# Patient Record
Sex: Female | Born: 1975
Health system: Southern US, Community
[De-identification: ages and names within clinical notes are randomized; demographics above are authoritative.]

## PROBLEM LIST (undated history)

## (undated) DIAGNOSIS — D649 Anemia, unspecified: Secondary | ICD-10-CM

## (undated) DIAGNOSIS — K5900 Constipation, unspecified: Secondary | ICD-10-CM

## (undated) DIAGNOSIS — R450 Nervousness: Secondary | ICD-10-CM

## (undated) DIAGNOSIS — B019 Varicella without complication: Secondary | ICD-10-CM

## (undated) DIAGNOSIS — R5383 Other fatigue: Secondary | ICD-10-CM

## (undated) DIAGNOSIS — F419 Anxiety disorder, unspecified: Secondary | ICD-10-CM

## (undated) DIAGNOSIS — F439 Reaction to severe stress, unspecified: Secondary | ICD-10-CM

## (undated) DIAGNOSIS — Z9289 Personal history of other medical treatment: Secondary | ICD-10-CM

## (undated) DIAGNOSIS — E559 Vitamin D deficiency, unspecified: Secondary | ICD-10-CM

## (undated) HISTORY — DX: Varicella without complication: B01.9

## (undated) HISTORY — DX: Constipation, unspecified: K59.00

## (undated) HISTORY — DX: Other fatigue: R53.83

## (undated) HISTORY — DX: Nervousness: R45.0

## (undated) HISTORY — DX: Personal history of other medical treatment: Z92.89

## (undated) HISTORY — DX: Anxiety disorder, unspecified: F41.9

## (undated) HISTORY — DX: Vitamin D deficiency, unspecified: E55.9

## (undated) HISTORY — DX: Reaction to severe stress, unspecified: F43.9

---

## 2013-01-29 ENCOUNTER — Encounter (HOSPITAL_COMMUNITY): Payer: Self-pay | Admitting: *Deleted

## 2013-01-29 ENCOUNTER — Observation Stay (HOSPITAL_COMMUNITY)
Admission: AD | Admit: 2013-01-29 | Discharge: 2013-01-29 | Disposition: A | Discharge status: Home / Self Care | Payer: BC Managed Care – PPO | Source: Ambulatory Visit | Attending: Obstetrics and Gynecology | Admitting: Obstetrics and Gynecology

## 2013-01-29 DIAGNOSIS — N920 Excessive and frequent menstruation with regular cycle: Principal | ICD-10-CM | POA: Insufficient documentation

## 2013-01-29 DIAGNOSIS — D649 Anemia, unspecified: Secondary | ICD-10-CM | POA: Diagnosis present

## 2013-01-29 DIAGNOSIS — D500 Iron deficiency anemia secondary to blood loss (chronic): Secondary | ICD-10-CM | POA: Insufficient documentation

## 2013-01-29 LAB — CBC WITH DIFFERENTIAL/PLATELET
Basophils Absolute: 0 10*3/uL (ref 0.0–0.1)
Basophils Relative: 0 % (ref 0–1)
Eosinophils Absolute: 0.4 10*3/uL (ref 0.0–0.7)
Eosinophils Relative: 5 % (ref 0–5)
HCT: 26.5 % — ABNORMAL LOW (ref 36.0–46.0)
MCHC: 33.2 g/dL (ref 30.0–36.0)
MCV: 79.1 fL (ref 78.0–100.0)
Monocytes Absolute: 0.8 10*3/uL (ref 0.1–1.0)
Platelets: 273 10*3/uL (ref 150–400)
RDW: 18.3 % — ABNORMAL HIGH (ref 11.5–15.5)

## 2013-01-29 LAB — PREPARE RBC (CROSSMATCH)

## 2013-01-29 LAB — ABO/RH: ABO/RH(D): A POS

## 2013-01-29 MED ORDER — DIPHENHYDRAMINE HCL 25 MG PO CAPS
25.0000 mg | ORAL_CAPSULE | Freq: Once | ORAL | Status: AC
  Administered 2013-01-29: 25 mg via ORAL
  Filled 2013-01-29: qty 1

## 2013-01-29 MED ORDER — SODIUM CHLORIDE 0.9 % IV SOLN
INTRAVENOUS | Status: DC
  Administered 2013-01-29: 20 mL/h via INTRAVENOUS

## 2013-01-29 MED ORDER — ACETAMINOPHEN 325 MG PO TABS
650.0000 mg | ORAL_TABLET | Freq: Once | ORAL | Status: AC
  Administered 2013-01-29: 650 mg via ORAL
  Filled 2013-01-29: qty 2

## 2013-01-29 NOTE — Discharge Summary (Signed)
  Physician Discharge Summary  Patient ID: Madison Robinson MRN: [id number] DOB/AGE: 38-Dec-1977 28 y.o.  Admit date: 01/29/2013 Discharge date: 01/29/2013  Admission Diagnoses: BLOOD TRANSFUSION for severe anemia  Discharge Diagnoses:  same  Discharged Condition: good  Hospital Course:   Consults: None  Treatments: transfusion of 3 units [facility]  Discharge Hgb/Hct:8.8 / 26.5  Disposition: home  Follow-up: Dr Su Hilt as scheduled      Signed: Esmeralda Arthur, MD 01/29/2013, 1:44 PM

## 2013-01-29 NOTE — H&P (Signed)
Madison Robinson is an 37 y.o. female. Pt seen today in office by Dr Madison Robinson for heavy bleeding. LMP 6-17. Then onset bleeding on 7-10 that was heavy (changing pad and tampon every ), bleeding slowed today. Per note from Dr Madison Robinson   Pertinent Gynecological History: Menses: flow is excessive with use of hourly pads or tampons on heaviest days Bleeding: intermenstrual bleeding Contraception: OCP (estrogen/progesterone) Madison Robinson  DES exposure: denies Blood transfusions: none Sexually transmitted diseases: no past history Previous GYN Procedures: none  Last mammogram: n/a Date:  Last pap: normal Date: 11/2010 OB History: G1, P1   Menstrual History: Menarche age: 52  Patient's last menstrual period was 01/05/2013.    History reviewed. No pertinent past medical history.  History reviewed. No pertinent past surgical history.  Family History  Problem Relation Age of Onset  . Hypertension Mother   . Diabetes Father   . Hypertension Father   . Cancer Paternal Grandmother     Social History:  reports that  drinks alcohol. She reports that she does not use illicit drugs. Her tobacco history is not on file. 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  Respiratory: Positive for shortness of breath.        W exertion   Cardiovascular: Positive for palpitations.  Gastrointestinal: Negative for heartburn, nausea, vomiting and abdominal pain.  Genitourinary: Negative for dysuria.  Neurological: Positive for weakness and headaches. Negative for dizziness and loss of consciousness.  All other systems reviewed and are negative.    Blood pressure 113/74, pulse 108, temperature 98.8 F (37.1 C), temperature source Oral, resp. rate 22, height 4' 11.5" (1.511 m), weight 150 lb 0.1 oz  (68.042 kg), last menstrual period 01/05/2013, SpO2 100.00%.  Filed Vitals:   01/29/13 0130 01/29/13 0131 01/29/13 0132 01/29/13 0135  BP: 115/74 109/71 113/74 113/74  Pulse: 100 96 96 108  Temp:      TempSrc:      Resp:      Height:      Weight:      SpO2:         Physical Exam  Nursing note and vitals reviewed. Constitutional: She is oriented to person, place, and time. She appears well-developed and well-nourished. No distress.  HENT:  Head: Normocephalic.  Eyes: Pupils are equal, round, and reactive to light.  Neck: Normal range of motion.  Cardiovascular: Regular rhythm and normal heart sounds.   Tachycardic   Respiratory: Effort normal and breath sounds normal.  GI: Soft. Bowel sounds are normal. She exhibits no distension. There is no tenderness.  Genitourinary:  Deferred   Musculoskeletal: Normal range of motion.  Neurological: She is alert and oriented to person, place, and time. She has normal reflexes.  Skin: Skin is warm and dry.  Psychiatric: She has a normal mood and affect. Her behavior is normal.    No results found for this or any previous visit (from the past 24 hour(s)).  No results found.  Assessment/Plan: 37yo AAF  Menorrhagia symptomatic anemia   hgb 5.7 per report from [facility] (full report not available at the time of this note)  Admit to observation to 3rd floor per c/w Dr Pennie Rushing  Transfuse 3 units PRBC Tylenol and benadryl PO prior to tx Will check CBC after 3rd  unit tx'd rv'd R/B blood tx, pt accepts risks and wishes to proceed with transfusion    Madison Robinson M 01/29/2013, 1:54 AM

## 2013-01-29 NOTE — Progress Notes (Signed)
HD# 1  Subjective: Patient reports feeling much better with resolved SOB and palpitations, denies dizziness and other anemia symptoms Tolerating normal  diet without difficulty. No nausea / vomiting.  Ambulating and voiding.  Objective: BP 120/66  Pulse 100  Temp(Src) 98.2 F (36.8 C) (Oral)  Resp 18  Ht 4' 11.5" (1.511 m)  Wt 150 lb 0.1 oz (68.042 kg)  BMI 29.8 kg/m2  SpO2 100%  LMP 01/05/2013 Lungs: clear Heart: normal rate and rhythm Abdomen:soft and appropriately tender Extremities: Homans sign is negative, no sign of DVT  Assessment: s/p : anemia and transfusion of 3 units PRC Doing well  Plan: Discharge home Follow-up with Dr Su Hilt as scheduled to pursus menorrhagia work-up and plan of care Patient encouraged to call with heavy bleeding  LOS: 0 days    Ainhoa Rallo A 01/29/2013, 1:21 PM

## 2013-01-30 LAB — TYPE AND SCREEN
ABO/RH(D): A POS
Unit division: 0

## 2013-02-22 ENCOUNTER — Other Ambulatory Visit: Payer: Self-pay | Admitting: Obstetrics and Gynecology

## 2013-03-02 ENCOUNTER — Other Ambulatory Visit: Payer: Self-pay | Admitting: Obstetrics and Gynecology

## 2013-03-03 ENCOUNTER — Other Ambulatory Visit: Payer: Self-pay | Admitting: Obstetrics and Gynecology

## 2013-03-03 NOTE — H&P (Signed)
Madison Robinson is a 37 YO female,  P 1-0-0-1, who   presents for  hysteroscopy, dilatation and curettage because of menorrhagia, severe anemia and endometrial masses.  Patient has a history of regular menses until January 05, 2013,  when she bled so much that her hemoglobin decreased to 5.7.  She had to change her pad every 30 minutes but experienced minimal cramps.  Consequently she was transfused 3 units of packed red blood cells raising her hemoglobin level to 8.8.  Patient has continued to take birth control pills and denies, urinary tract symptoms, changes in bowel function, vaginitis symptoms or dyspareunia. A pelvic ultrasound, February 19, 2013 showed uterus: 5.70 x 5.74 x 4.71 cm, endometrium: 1.80 mm, a posterior right intramural fibroid: 3.19 x 2.49 x 2.86 cm and #2 separate masses within endometrial cavity, a color stalk on color Doppler 1.4 x 1.4 x 1.3 cm and 1.7 x 1.2 x 1.2 cm-sub-mucosal fibroid vs polyps cannot be ruled out.  Both ovaries appeared normal on this study.  On February 19, 2013 her hemoglobin was 9.8.  Given the severity of her vaginal bleeding and the findings on ultrasound, the patient has decided to proceed with hysteroscopic resection of endometrial mass and dilatation with curettage  . Past Medical History  OB History: G1;  P 1-0-0-1. SVD, 1998  GYN History: menarche: 37 YO;  LMP: 01/05/2013; Contracepton oral contraceptives (estrogen/progesterone)  The patient denies history of sexually transmitted disease.  Denies history of abnormal PAP smear;  Last PAP smear: 2013  Medical History:  Vitamin D Deficency and Severe Anemia (HGB as low as 5.7)  Surgical History: Negative  Family History: Hypertension, Diabetes and Cancer  Social History: Single and employed as an RN with [employer]: Denies alcohol, tobacco or illicit drug use  Medications:  Jolessa 0.15/30 daily Vitamin D 50,000 units twice a week  Allergic to Shellfish  Denies sensitivity to peanuts, soy,  latex or adhesives.   ROS: Admits to occasional headache but denies vision changes, nasal congestion, dysphagia, tinnitus, dizziness, hoarseness, cough,  chest pain, shortness of breath, nausea, vomiting, diarrhea,constipation,  urinary frequency, urgency  dysuria, hematuria, vaginitis symptoms, pelvic pain, swelling of joints,easy bruising,  myalgias, arthralgias, skin rashes, unexplained weight loss and except as is mentioned in the history of present illness, patient's review of systems is otherwise negative.  Physical Exam  Bp: 110/58;  P: 90;  R: 16;  Temp.: 98.9 degrees F; Weight: 155 lbs.  Height: 4' 11"    BMI: 31.3  Neck: supple without masses or thyromegaly Lungs: clear to auscultation Heart: regular rate and rhythm Abdomen: soft, non-tender and no organomegaly Pelvic:EGBUS- wnl; vagina-normal rugae; uterus-normal size, cervix without lesions or motion tenderness; adnexae-no tenderness or masses Extremities:  no clubbing, cyanosis or edema   Assesment:  Menorrhagia                       Endometrial Masses                       H/O Severe Anemia    Disposition:  A discussion was held with patient regarding the indication for her procedure(s) along with the risks, which include but are not limited to: reaction to anesthesia, damage to adjacent organs, infection, endometrial scarring  and excessive bleeding. The patient verbalized understanding of these risks and has consented to proceed with Hysteroscopy, Dilatation, Curettage and Possible Resection of Endometrial Mass at [facility],    March 17, 2013 at 12 noon.   CSN# [id number]   Jun Rightmyer J. Quaneshia Wareing, PA-C  for Dr. Angela Y.Roberts   

## 2013-03-04 ENCOUNTER — Encounter (HOSPITAL_COMMUNITY): Payer: Self-pay | Admitting: Pharmacy Technician

## 2013-03-05 ENCOUNTER — Encounter (HOSPITAL_COMMUNITY)
Admission: RE | Admit: 2013-03-05 | Discharge: 2013-03-05 | Disposition: A | Discharge status: Home / Self Care | Payer: BC Managed Care – PPO | Source: Ambulatory Visit | Attending: Obstetrics and Gynecology | Admitting: Obstetrics and Gynecology

## 2013-03-05 ENCOUNTER — Encounter (HOSPITAL_COMMUNITY): Payer: Self-pay

## 2013-03-05 DIAGNOSIS — Z01818 Encounter for other preprocedural examination: Secondary | ICD-10-CM | POA: Insufficient documentation

## 2013-03-05 DIAGNOSIS — Z01812 Encounter for preprocedural laboratory examination: Secondary | ICD-10-CM | POA: Insufficient documentation

## 2013-03-05 HISTORY — DX: Anemia, unspecified: D64.9

## 2013-03-05 LAB — CBC
MCHC: 31.9 g/dL (ref 30.0–36.0)
Platelets: 307 10*3/uL (ref 150–400)
RDW: 18.7 % — ABNORMAL HIGH (ref 11.5–15.5)

## 2013-03-05 NOTE — Pre-Procedure Instructions (Signed)
SDS BB history log given to lab at 0932am

## 2013-03-05 NOTE — Patient Instructions (Addendum)
   Your procedure is scheduled on: 03/17/2013  Enter through the Main Entrance of [facility] at: 1030am Pick up the phone at the desk and dial [phone] and inform us of your arrival.  Please call this number if you have any problems the morning of surgery: [phone]  Remember: Do not eat food after midnight: Tuesday 03/16/2013 Do not drink clear liquids after:0800AM 03/17/2013 Take these medicines the morning of surgery with a SIP OF WATER: N/A  Do not wear jewelry, make-up, or FINGER nail polish No metal in your hair or on your body. Do not wear lotions, powders, perfumes. You may wear deodorant.  Do not bring valuables to the hospital. Contacts, dentures or bridgework may not be worn into surgery.  Leave suitcase in the car. After Surgery it may be brought to your room. For patients being admitted to the hospital, checkout time is 11:00am the day of discharge.  Patients discharged on the day of surgery will not be allowed to drive home.

## 2013-03-05 NOTE — Pre-Procedure Instructions (Signed)
Pt takes Seasonale and reports having menstrual cycles quarterly LMP 01/2013

## 2013-03-05 NOTE — Pre-Procedure Instructions (Deleted)
SDS BB history log given to lab

## 2013-03-17 ENCOUNTER — Encounter (HOSPITAL_COMMUNITY): Payer: Self-pay | Admitting: Anesthesiology

## 2013-03-17 ENCOUNTER — Ambulatory Visit (HOSPITAL_COMMUNITY)
Admission: RE | Admit: 2013-03-17 | Discharge: 2013-03-17 | Disposition: A | Discharge status: Home / Self Care | Payer: BC Managed Care – PPO | Source: Ambulatory Visit | Attending: Obstetrics and Gynecology | Admitting: Obstetrics and Gynecology

## 2013-03-17 ENCOUNTER — Encounter (HOSPITAL_COMMUNITY)
Admission: RE | Disposition: A | Discharge status: Home / Self Care | Payer: Self-pay | Source: Ambulatory Visit | Attending: Obstetrics and Gynecology

## 2013-03-17 ENCOUNTER — Ambulatory Visit (HOSPITAL_COMMUNITY): Payer: BC Managed Care – PPO | Admitting: Anesthesiology

## 2013-03-17 DIAGNOSIS — N920 Excessive and frequent menstruation with regular cycle: Secondary | ICD-10-CM | POA: Insufficient documentation

## 2013-03-17 DIAGNOSIS — D500 Iron deficiency anemia secondary to blood loss (chronic): Secondary | ICD-10-CM | POA: Insufficient documentation

## 2013-03-17 DIAGNOSIS — D251 Intramural leiomyoma of uterus: Secondary | ICD-10-CM | POA: Insufficient documentation

## 2013-03-17 DIAGNOSIS — D250 Submucous leiomyoma of uterus: Secondary | ICD-10-CM | POA: Insufficient documentation

## 2013-03-17 HISTORY — PX: DILATATION & CURRETTAGE/HYSTEROSCOPY WITH RESECTOCOPE: SHX5572

## 2013-03-17 LAB — CBC
HCT: 30.5 % — ABNORMAL LOW (ref 36.0–46.0)
Hemoglobin: 9.7 g/dL — ABNORMAL LOW (ref 12.0–15.0)
MCH: 25.7 pg — ABNORMAL LOW (ref 26.0–34.0)
MCHC: 31.8 g/dL (ref 30.0–36.0)
MCV: 80.9 fL (ref 78.0–100.0)

## 2013-03-17 SURGERY — DILATATION & CURETTAGE/HYSTEROSCOPY WITH RESECTOCOPE
Anesthesia: General | Site: Cervix | Wound class: Contaminated

## 2013-03-17 MED ORDER — GLYCINE 1.5 % IR SOLN
Status: DC | PRN
  Administered 2013-03-17: 3000 mL

## 2013-03-17 MED ORDER — FENTANYL CITRATE 0.05 MG/ML IJ SOLN: 25.0000 ug | INTRAMUSCULAR | Status: DC | PRN

## 2013-03-17 MED ORDER — MIDAZOLAM HCL 5 MG/5ML IJ SOLN
INTRAMUSCULAR | Status: DC | PRN
  Administered 2013-03-17: 2 mg via INTRAVENOUS

## 2013-03-17 MED ORDER — ONDANSETRON HCL 4 MG/2ML IJ SOLN
INTRAMUSCULAR | Status: DC | PRN
  Administered 2013-03-17: 4 mg via INTRAVENOUS

## 2013-03-17 MED ORDER — PROPOFOL 10 MG/ML IV BOLUS
INTRAVENOUS | Status: DC | PRN
  Administered 2013-03-17: 120 mg via INTRAVENOUS

## 2013-03-17 MED ORDER — LACTATED RINGERS IV SOLN
INTRAVENOUS | Status: DC
  Administered 2013-03-17 (×2): via INTRAVENOUS

## 2013-03-17 MED ORDER — METOCLOPRAMIDE HCL 5 MG/ML IJ SOLN: 10.0000 mg | Freq: Once | INTRAMUSCULAR | Status: DC | PRN

## 2013-03-17 MED ORDER — KETOROLAC TROMETHAMINE 30 MG/ML IJ SOLN
INTRAMUSCULAR | Status: AC
  Filled 2013-03-17: qty 1

## 2013-03-17 MED ORDER — LIDOCAINE HCL (CARDIAC) 20 MG/ML IV SOLN
INTRAVENOUS | Status: DC | PRN
  Administered 2013-03-17: 50 mg via INTRAVENOUS

## 2013-03-17 MED ORDER — KETOROLAC TROMETHAMINE 30 MG/ML IJ SOLN
INTRAMUSCULAR | Status: DC | PRN
  Administered 2013-03-17: 30 mg via INTRAVENOUS

## 2013-03-17 MED ORDER — MEPERIDINE HCL 25 MG/ML IJ SOLN: 6.2500 mg | INTRAMUSCULAR | Status: DC | PRN

## 2013-03-17 MED ORDER — LIDOCAINE HCL 1 % IJ SOLN
INTRAMUSCULAR | Status: DC | PRN
  Administered 2013-03-17: 10 mL

## 2013-03-17 MED ORDER — FENTANYL CITRATE 0.05 MG/ML IJ SOLN
INTRAMUSCULAR | Status: AC
  Filled 2013-03-17: qty 2

## 2013-03-17 MED ORDER — ONDANSETRON HCL 4 MG/2ML IJ SOLN
INTRAMUSCULAR | Status: AC
  Filled 2013-03-17: qty 2

## 2013-03-17 MED ORDER — FENTANYL CITRATE 0.05 MG/ML IJ SOLN
INTRAMUSCULAR | Status: DC | PRN
  Administered 2013-03-17: 100 ug via INTRAVENOUS

## 2013-03-17 MED ORDER — DEXAMETHASONE SODIUM PHOSPHATE 10 MG/ML IJ SOLN
INTRAMUSCULAR | Status: AC
  Filled 2013-03-17: qty 1

## 2013-03-17 MED ORDER — MIDAZOLAM HCL 2 MG/2ML IJ SOLN
INTRAMUSCULAR | Status: AC
  Filled 2013-03-17: qty 2

## 2013-03-17 MED ORDER — OXYCODONE-ACETAMINOPHEN 5-325 MG PO TABS: 1.0000 | ORAL_TABLET | Freq: Four times a day (QID) | ORAL | Status: DC | PRN

## 2013-03-17 MED ORDER — IBUPROFEN 600 MG PO TABS: 600.0000 mg | ORAL_TABLET | Freq: Four times a day (QID) | ORAL | Status: DC | PRN

## 2013-03-17 MED ORDER — PROPOFOL 10 MG/ML IV EMUL
INTRAVENOUS | Status: AC
  Filled 2013-03-17: qty 20

## 2013-03-17 MED ORDER — DEXAMETHASONE SODIUM PHOSPHATE 10 MG/ML IJ SOLN
INTRAMUSCULAR | Status: DC | PRN
  Administered 2013-03-17: 10 mg via INTRAVENOUS

## 2013-03-17 MED ORDER — LIDOCAINE HCL (CARDIAC) 20 MG/ML IV SOLN
INTRAVENOUS | Status: AC
  Filled 2013-03-17: qty 5

## 2013-03-17 SURGICAL SUPPLY — 18 items
CANISTER SUCTION 2500CC (MISCELLANEOUS) ×2 IMPLANT
CATH ROBINSON RED A/P 16FR (CATHETERS) ×2 IMPLANT
CLOTH BEACON ORANGE TIMEOUT ST (SAFETY) ×2 IMPLANT
CONTAINER PREFILL 10% NBF 60ML (FORM) ×4 IMPLANT
DRESSING TELFA 8X3 (GAUZE/BANDAGES/DRESSINGS) ×2 IMPLANT
ELECT REM PT RETURN 9FT ADLT (ELECTROSURGICAL) ×2
ELECTRODE REM PT RTRN 9FT ADLT (ELECTROSURGICAL) ×1 IMPLANT
GLOVE BIO SURGEON STRL SZ7.5 (GLOVE) ×2 IMPLANT
GLOVE BIOGEL PI IND STRL 6.5 (GLOVE) ×1 IMPLANT
GLOVE BIOGEL PI IND STRL 7.5 (GLOVE) ×2 IMPLANT
GLOVE BIOGEL PI INDICATOR 6.5 (GLOVE) ×1
GLOVE BIOGEL PI INDICATOR 7.5 (GLOVE) ×2
GOWN STRL REIN XL XLG (GOWN DISPOSABLE) ×4 IMPLANT
LOOP ANGLED CUTTING 22FR (CUTTING LOOP) ×2 IMPLANT
PACK HYSTEROSCOPY LF (CUSTOM PROCEDURE TRAY) ×2 IMPLANT
PAD OB MATERNITY 4.3X12.25 (PERSONAL CARE ITEMS) ×2 IMPLANT
TOWEL OR 17X24 6PK STRL BLUE (TOWEL DISPOSABLE) ×4 IMPLANT
WATER STERILE IRR 1000ML POUR (IV SOLUTION) ×2 IMPLANT

## 2013-03-17 NOTE — Anesthesia Postprocedure Evaluation (Signed)
Anesthesia Post Note  Patient: Madison Robinson  Procedure(s) Performed: Procedure(s) (LRB): DILATATION & CURETTAGE/HYSTEROSCOPY WITH RESECTOCOPE (N/A)  Anesthesia type: General  Patient location: PACU  Post pain: Pain level controlled  Post assessment: Post-op Vital signs reviewed  Last Vitals:  Filed Vitals:   03/17/13 1345  BP:   Pulse:   Temp:   Resp: 14    Post vital signs: Reviewed  Level of consciousness: sedated  Complications: No apparent anesthesia complications

## 2013-03-17 NOTE — Op Note (Addendum)
Preop Diagnosis: Menorrhagia, endometrial mass:   Postop Diagnosis: Menorrhagia, endometrial mass:   Procedure: DILATATION & CURETTAGE/HYSTEROSCOPY WITH RESECTOCOPE   Anesthesia: General via LMA  Attending: Purcell Nails, MD   Assistant: N/a  Findings: About 2cm submucosal fibroid on anterior wall of uterus  Pathology: 1.Portions of resected fibroid 2.Endometrial Curettings  Fluids: 1000 cc  UOP: QS via straight cath prior to procedure  EBL: Minimal  Complications: None  Procedure: The patient was taken to the operating room after the risks, benefits and alternatives were discussed with the patient. The patient verbalized understanding and consent signed and witnessed. The patient was placed under general anesthesia with an LMA per anesthesiologist and prepped and draped in the normal sterile fashion.  Time Out was performed per protocol.  A bivalve speculum was placed in the patient's vagina and the anterior lip of the cervix was grasped with a single tooth tenaculum. A paracervical block was administered using a total of 10 cc of 1% lidocaine. The uterus sounded to 10.5 cm. The cervix was dilated for passage of the hysteroscope.  The hysteroscope was introduced into the uterine cavity and findings as noted above.  Resectoscope was then introduced and fibroid resected without difficulty.  Sharp curettage was performed until a gritty texture was noted and curettings were sent to pathology. The resectoscope was reintroduced and no obvious remaining intracavitary lesions were noted.  All instruments were removed. Sponge lap and needle count was correct. The patient tolerated the procedure well and was returned to the recovery room in good condition.  Plans Mirena at post op appointment

## 2013-03-17 NOTE — Interval H&P Note (Signed)
History and Physical Interval Note:  03/17/2013 11:06 AM  Madison Robinson  has presented today for surgery, with the diagnosis of Menorrhagia, endometrial mass:  The various methods of treatment have been discussed with the patient and family. After consideration of risks, benefits and other options for treatment, the patient has consented to  Procedure(s): DILATATION & CURETTAGE/HYSTEROSCOPY WITH RESECTOCOPE (N/A) as a surgical intervention .  The patient's history has been reviewed, patient examined, no change in status, stable for surgery.  I have reviewed the patient's chart and labs.  Questions were answered to the patient's satisfaction.     Purcell Nails

## 2013-03-17 NOTE — Anesthesia Preprocedure Evaluation (Signed)
Anesthesia Evaluation  Patient identified by MRN, date of birth, ID band Patient awake    Reviewed: Allergy & Precautions, H&P , NPO status , Patient's Chart, lab work & pertinent test results  Airway Mallampati: II TM Distance: >3 FB Neck ROM: Full    Dental no notable dental hx. (+) Caps   Pulmonary neg pulmonary ROS,  breath sounds clear to auscultation  Pulmonary exam normal       Cardiovascular negative cardio ROS  Rhythm:Regular Rate:Normal     Neuro/Psych negative neurological ROS  negative psych ROS   GI/Hepatic negative GI ROS, Neg liver ROS,   Endo/Other  negative endocrine ROS  Renal/GU negative Renal ROS  negative genitourinary   Musculoskeletal negative musculoskeletal ROS (+)   Abdominal Normal abdominal exam  (+)   Peds  Hematology  (+) Blood dyscrasia, anemia ,   Anesthesia Other Findings   Reproductive/Obstetrics Endometrial Mass Menorrhagia                           Anesthesia Physical Anesthesia Plan  ASA: I  Anesthesia Plan: General   Post-op Pain Management:    Induction: Intravenous  Airway Management Planned: LMA  Additional Equipment:   Intra-op Plan:   Post-operative Plan: Extubation in OR  Informed Consent: I have reviewed the patients History and Physical, chart, labs and discussed the procedure including the risks, benefits and alternatives for the proposed anesthesia with the patient or authorized representative who has indicated his/her understanding and acceptance.   Dental advisory given  Plan Discussed with: Anesthesiologist, CRNA and Surgeon  Anesthesia Plan Comments:         Anesthesia Quick Evaluation

## 2013-03-17 NOTE — Transfer of Care (Signed)
Immediate Anesthesia Transfer of Care Note  Patient: Madison Robinson  Procedure(s) Performed: Procedure(s): DILATATION & CURETTAGE/HYSTEROSCOPY WITH RESECTOCOPE (N/A)  Patient Location: PACU  Anesthesia Type:General  Level of Consciousness: awake, alert , oriented and patient cooperative  Airway & Oxygen Therapy: Patient Spontanous Breathing  Post-op Assessment: Report given to PACU RN, Post -op Vital signs reviewed and stable and Patient moving all extremities X 4  Post vital signs: Reviewed and stable  Complications: No apparent anesthesia complications

## 2013-03-17 NOTE — H&P (View-Only) (Signed)
Madison Robinson is a 37 YO female,  P 1-0-0-1, who   presents for  hysteroscopy, dilatation and curettage because of menorrhagia, severe anemia and endometrial masses.  Patient has a history of regular menses until January 05, 2013,  when she bled so much that her hemoglobin decreased to 5.7.  She had to change her pad every 30 minutes but experienced minimal cramps.  Consequently she was transfused 3 units of packed red blood cells raising her hemoglobin level to 8.8.  Patient has continued to take birth control pills and denies, urinary tract symptoms, changes in bowel function, vaginitis symptoms or dyspareunia. A pelvic ultrasound, February 19, 2013 showed uterus: 5.70 x 5.74 x 4.71 cm, endometrium: 1.80 mm, a posterior right intramural fibroid: 3.19 x 2.49 x 2.86 cm and #2 separate masses within endometrial cavity, a color stalk on color Doppler 1.4 x 1.4 x 1.3 cm and 1.7 x 1.2 x 1.2 cm-sub-mucosal fibroid vs polyps cannot be ruled out.  Both ovaries appeared normal on this study.  On February 19, 2013 her hemoglobin was 9.8.  Given the severity of her vaginal bleeding and the findings on ultrasound, the patient has decided to proceed with hysteroscopic resection of endometrial mass and dilatation with curettage  . Past Medical History  OB History: G1;  P 1-0-0-1. SVD, 1998  GYN History: menarche: 37 YO;  LMP: 01/05/2013; Contracepton oral contraceptives (estrogen/progesterone)  The patient denies history of sexually transmitted disease.  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   ROS: Admits to occasional headache but denies vision changes, nasal congestion, dysphagia, tinnitus, dizziness, hoarseness, cough,  chest pain, shortness of breath, nausea, vomiting, diarrhea,constipation,  urinary frequency, urgency  dysuria, hematuria, vaginitis symptoms, pelvic pain, swelling of joints,easy bruising,  myalgias, arthralgias, skin rashes, unexplained weight loss and except as is mentioned in the history of present illness, patient's review of systems is otherwise negative.  Physical Exam  Bp: 110/58;  P: 90;  R: 16;  Temp.: 98.9 degrees F; Weight: 155 lbs.  Height: 4\' 11"     BMI: 31.3  Neck: supple without masses or thyromegaly Lungs: clear to auscultation Heart: regular rate and rhythm Abdomen: soft, non-tender and no organomegaly Pelvic:EGBUS- wnl; vagina-normal rugae; uterus-normal size, cervix without lesions or motion tenderness; adnexae-no tenderness or masses Extremities:  no clubbing, cyanosis or edema   Assesment:  Menorrhagia                       Endometrial Masses                       H/O Severe Anemia    Disposition:  A discussion was held with patient regarding the indication for her procedure(s) along with the risks, which include but are not limited to: reaction to anesthesia, damage to adjacent organs, infection, endometrial scarring  and excessive bleeding. The patient verbalized understanding of these risks and has consented to proceed with Hysteroscopy, Dilatation, Curettage and Possible Resection of Endometrial Mass at [facility] [place],  March 17, 2013 at 12 noon.   CSN# [id number]   Breeana Sawtelle J. Lowell Guitar, PA-C  for Dr. Woodroe Mode.Su Hilt

## 2013-03-18 ENCOUNTER — Encounter (HOSPITAL_COMMUNITY): Payer: Self-pay | Admitting: Obstetrics and Gynecology

## 2015-09-13 ENCOUNTER — Encounter (HOSPITAL_COMMUNITY): Payer: Self-pay | Admitting: Emergency Medicine

## 2015-09-13 ENCOUNTER — Emergency Department (HOSPITAL_COMMUNITY)
Admission: EM | Admit: 2015-09-13 | Discharge: 2015-09-13 | Disposition: A | Discharge status: Home / Self Care | Payer: BLUE CROSS/BLUE SHIELD | Source: Home / Self Care | Attending: Family Medicine | Admitting: Family Medicine

## 2015-09-13 DIAGNOSIS — L259 Unspecified contact dermatitis, unspecified cause: Secondary | ICD-10-CM | POA: Diagnosis not present

## 2015-09-13 MED ORDER — PREDNISONE 50 MG PO TABS: 50.0000 mg | ORAL_TABLET | Freq: Every day | ORAL | Status: DC

## 2015-09-13 NOTE — ED Notes (Signed)
Rash on face, unknown source.  No changes in make-up or lip balm, etc.  Onset Saturday of itchiness around mouth and now spreading over face.

## 2015-09-13 NOTE — ED Provider Notes (Signed)
CSN: [id number]     Arrival date & time 09/13/15  1312 History   First MD Initiated Contact with Patient 09/13/15 1423     Chief Complaint  Patient presents with  . Rash   (Consider location/radiation/quality/duration/timing/severity/associated sxs/prior Treatment) HPI Pt states that her lips became swollen last Friday, then developed a rash around the lips and now face.  States there is some itching, but no hives. Patient denies change in makeup lip gloss, or other facial applications.   Denies pain, some relief with benadryl Past Medical History  Diagnosis Date  . Anemia     01/29/2013 BLOOD TRANFUSION   Past Surgical History  Procedure Laterality Date  . Dilatation & currettage/hysteroscopy with resectocope N/A 03/17/2013    Procedure: DILATATION & CURETTAGE/HYSTEROSCOPY WITH RESECTOCOPE;  Surgeon: Delice Lesch, MD;  Location: [facility];  Service: Gynecology;  Laterality: N/A;   Family History  Problem Relation Age of Onset  . Hypertension Mother   . Diabetes Father   . Hypertension Father   . Cancer Paternal Grandmother    Social History  Substance Use Topics  . Smoking status: Never Smoker   . Smokeless tobacco: None  . Alcohol Use: Yes     Comment: wine once a month   OB History    No data available     Review of Systems Rash on face Allergies  Penicillins  Home Medications   Prior to Admission medications   Medication Sig Start Date End Date Taking? Authorizing Provider  DiphenhydrAMINE HCl (BENADRYL ALLERGY PO) Take by mouth.   Yes Historical Provider, MD  ferrous fumarate (HEMOCYTE - 106 MG FE) 325 (106 FE) MG TABS tablet Take 1 tablet by mouth daily.    Historical Provider, MD  ibuprofen (ADVIL,MOTRIN) 600 MG tablet Take 1 tablet (600 mg total) by mouth every 6 (six) hours as needed for pain. 03/17/13   Everett Graff, MD  oxyCODONE-acetaminophen (PERCOCET) 5-325 MG per tablet Take 1-2 tablets by mouth every 6 (six) hours as needed for pain. 03/17/13    Everett Graff, MD  Vitamin D, Ergocalciferol, (DRISDOL) 50000 UNITS CAPS capsule Take 50,000 Units by mouth 2 (two) times a week. Tuesdays and thursdays    Historical Provider, MD   Meds Ordered and Administered this Visit  Medications - No data to display  BP 108/74 mmHg  Pulse 74  Temp(Src) 98.1 F (36.7 C) (Oral)  Resp 16  SpO2 100%  LMP 08/13/2015 No data found.   Physical Exam  Constitutional: She is oriented to person, place, and time. She appears well-developed and well-nourished. No distress.  HENT:  Head: Normocephalic and atraumatic.  Neurological: She is alert and oriented to person, place, and time.  Skin: Skin is warm and dry. No pallor.       ED Course  Procedures (including critical care time)  Labs Review Labs Reviewed - No data to display  Imaging Review No results found.   Visual Acuity Review  Right Eye Distance:   Left Eye Distance:   Bilateral Distance:    Right Eye Near:   Left Eye Near:    Bilateral Near:         MDM   1. Contact dermatitis    Suggest a short course of prednisone     Konrad Felix, [place] 09/13/15 1537

## 2015-09-13 NOTE — Discharge Instructions (Signed)

## 2015-09-21 ENCOUNTER — Other Ambulatory Visit: Payer: Self-pay

## 2015-09-21 DIAGNOSIS — Z1231 Encounter for screening mammogram for malignant neoplasm of breast: Secondary | ICD-10-CM

## 2015-09-28 ENCOUNTER — Ambulatory Visit
Admission: RE | Admit: 2015-09-28 | Discharge: 2015-09-28 | Disposition: A | Discharge status: Home / Self Care | Payer: BLUE CROSS/BLUE SHIELD | Source: Ambulatory Visit

## 2015-09-28 DIAGNOSIS — Z1231 Encounter for screening mammogram for malignant neoplasm of breast: Secondary | ICD-10-CM

## 2016-03-22 ENCOUNTER — Ambulatory Visit (HOSPITAL_COMMUNITY)
Admission: EM | Admit: 2016-03-22 | Discharge: 2016-03-22 | Disposition: A | Discharge status: Home / Self Care | Payer: BLUE CROSS/BLUE SHIELD | Attending: Physician Assistant | Admitting: Physician Assistant

## 2016-03-22 ENCOUNTER — Encounter (HOSPITAL_COMMUNITY): Payer: Self-pay | Admitting: Family Medicine

## 2016-03-22 DIAGNOSIS — L259 Unspecified contact dermatitis, unspecified cause: Secondary | ICD-10-CM | POA: Diagnosis not present

## 2016-03-22 MED ORDER — PREDNISONE 10 MG (48) PO TBPK: ORAL_TABLET | Freq: Every day | ORAL | 0 refills | Status: DC

## 2016-03-22 NOTE — ED Provider Notes (Signed)
CSN: [id number]     Arrival date & time 03/22/16  1622 History   First MD Initiated Contact with Patient 03/22/16 1650     Chief Complaint  Patient presents with  . Allergic Reaction   (Consider location/radiation/quality/duration/timing/severity/associated sxs/prior Treatment) Patient presents with swelling and rash to her upper face and around the eyes. It is pruritic. No known exposures. Happened about 6 months ago and this responded well to prednisone. Otherwise feels well.     Past Medical History:  Diagnosis Date  . Anemia    01/29/2013 BLOOD TRANFUSION   Past Surgical History:  Procedure Laterality Date  . DILATATION & CURRETTAGE/HYSTEROSCOPY WITH RESECTOCOPE N/A 03/17/2013   Procedure: DILATATION & CURETTAGE/HYSTEROSCOPY WITH RESECTOCOPE;  Surgeon: Delice Lesch, MD;  Location: [facility];  Service: Gynecology;  Laterality: N/A;   Family History  Problem Relation Age of Onset  . Hypertension Mother   . Diabetes Father   . Hypertension Father   . Cancer Paternal Grandmother    Social History  Substance Use Topics  . Smoking status: Never Smoker  . Smokeless tobacco: Never Used  . Alcohol use Yes     Comment: wine once a month   OB History    No data available     Review of Systems  All other systems reviewed and are negative.   Allergies  Penicillins  Home Medications   Prior to Admission medications   Medication Sig Start Date End Date Taking? Authorizing Provider  DiphenhydrAMINE HCl (BENADRYL ALLERGY PO) Take by mouth.    Historical Provider, MD  ferrous fumarate (HEMOCYTE - 106 MG FE) 325 (106 FE) MG TABS tablet Take 1 tablet by mouth daily.    Historical Provider, MD  predniSONE (STERAPRED UNI-PAK 48 TAB) 10 MG (48) TBPK tablet Take by mouth daily. 03/22/16   Bjorn Pippin, PA-C  Vitamin D, Ergocalciferol, (DRISDOL) 50000 UNITS CAPS capsule Take 50,000 Units by mouth 2 (two) times a week. Tuesdays and thursdays    Historical Provider, MD   Meds  Ordered and Administered this Visit  Medications - No data to display  BP 112/84   Pulse 82   Temp 98.1 F (36.7 C)   Resp 18   LMP 03/06/2016   SpO2 100%  No data found.   Physical Exam  Constitutional: She is oriented to person, place, and time. She appears well-developed and well-nourished. No distress.  HENT:  Head: Normocephalic and atraumatic.  Neurological: She is alert and oriented to person, place, and time.  Skin: Skin is warm and dry. Rash noted. She is not diaphoretic.  Macular papular rash to forehead, peri-orbital region and along the nose  Psychiatric: Her behavior is normal.  Nursing note and vitals reviewed.   Urgent Care Course   Clinical Course    Procedures (including critical care time)  Labs Review Labs Reviewed - No data to display  Imaging Review No results found.   Visual Acuity Review  Right Eye Distance:   Left Eye Distance:   Bilateral Distance:    Right Eye Near:   Left Eye Near:    Bilateral Near:         MDM   1. Contact dermatitis    Treat with pred pack as requested. F/U as needed.    Bjorn Pippin, PA-C 03/22/16 Grant Simon Llamas, PA-C 03/22/16 1708

## 2016-03-22 NOTE — Discharge Instructions (Signed)
This does look an irritant rash or contact dermatitis. Will treat you with a prednisone pack.

## 2016-03-22 NOTE — ED Triage Notes (Signed)
Pt here for itching and bumps to face under eyes with swelling. sts hx if same and was treated with prednisone. Denies any new lotions, food etc.

## 2017-05-14 ENCOUNTER — Encounter (HOSPITAL_COMMUNITY): Payer: Self-pay | Admitting: Emergency Medicine

## 2017-05-14 ENCOUNTER — Ambulatory Visit (HOSPITAL_COMMUNITY)
Admission: EM | Admit: 2017-05-14 | Discharge: 2017-05-14 | Disposition: A | Discharge status: Home / Self Care | Payer: 59 | Attending: Family Medicine | Admitting: Family Medicine

## 2017-05-14 DIAGNOSIS — L259 Unspecified contact dermatitis, unspecified cause: Secondary | ICD-10-CM | POA: Diagnosis not present

## 2017-05-14 MED ORDER — PREDNISONE 10 MG (21) PO TBPK
ORAL_TABLET | Freq: Every day | ORAL | 0 refills | Status: DC
Start: 2017-05-14 — End: 2017-10-29

## 2017-05-14 NOTE — ED Triage Notes (Signed)
PT reports itchy rash for 3-4 days. PT reports she has had this before and thinks it is related to a food allergy. Airway intact. Rash present over chest and sides of face. No new products.

## 2017-05-15 NOTE — ED Provider Notes (Signed)
  [facility]   [id number] 05/14/17 Arrival Time: [place]:  1. Contact dermatitis, unspecified contact dermatitis type, unspecified trigger     Meds ordered this encounter  Medications  . predniSONE (STERAPRED UNI-PAK 21 TAB) 10 MG (21) TBPK tablet    Sig: Take by mouth daily. Take as directed.    Dispense:  21 tablet    Refill:  0   May use OTC Benadryl as needed. Will f/u in 48-72 hours if not showing significant improvement. Reviewed expectations re: course of current medical issues. Questions answered. Outlined signs and symptoms indicating need for more acute intervention. Patient verbalized understanding. After Visit Summary given.   SUBJECTIVE:  Madison Robinson is a 41 y.o. female who presents with complaint of:  Rash: Patient complains of rash involving the torso and trunk, some on face. Onset gradual, a few days ago. Patient describes the rash as red. Rash has not changed over time. Rash is pruritic. Associated symptoms: none. Denies: abdominal pain, arthralgia, fever, nausea, sore throat and vomiting. Patient has not had previous evaluation of rash. Patient has not had contacts with similar rash. Patient has not identified precipitant. Medications currently using: none. Environmental exposures or allergies: none. No new skin products. No OTC treatment.  ROS: As per HPI.  OBJECTIVE: Vitals:   05/14/17 1827  BP: 114/81  Pulse: 68  Resp: 16  Temp: 98.7 F (37.1 C)  TempSrc: Temporal  SpO2: 100%  Weight: 152 lb (68.9 kg)  Height: 5' (1.524 m)    General appearance: alert; no distress Lungs: clear to auscultation bilaterally Heart: regular rate and rhythm Extremities: no edema Skin: warm and dry; hive like rash over torso/abdomen/back, some on face; + dermatographism Psychological: alert and cooperative; normal mood and affect   Allergies  Allergen Reactions  . Penicillins     Past Medical History:  Diagnosis Date  .  Anemia    01/29/2013 BLOOD TRANFUSION   Social History   Social History  . Marital status: Single    Spouse name: N/A  . Number of children: N/A  . Years of education: N/A   Occupational History  . Not on file.   Social History Main Topics  . Smoking status: Never Smoker  . Smokeless tobacco: Never Used  . Alcohol use Yes     Comment: wine once a month  . Drug use: No  . Sexual activity: No   Other Topics Concern  . Not on file   Social History Narrative  . No narrative on file   Family History  Problem Relation Age of Onset  . Hypertension Mother   . Diabetes Father   . Hypertension Father   . Cancer Paternal Grandmother    Past Surgical History:  Procedure Laterality Date  . DILATATION & CURRETTAGE/HYSTEROSCOPY WITH RESECTOCOPE N/A 03/17/2013   Procedure: DILATATION & CURETTAGE/HYSTEROSCOPY WITH RESECTOCOPE;  Surgeon: Delice Lesch, MD;  Location: [facility];  Service: Gynecology;  Laterality: N/AVanessa Kick, MD 05/15/17 [phone]

## 2017-10-22 ENCOUNTER — Other Ambulatory Visit: Payer: Self-pay | Admitting: Family Medicine

## 2017-10-22 DIAGNOSIS — Z1231 Encounter for screening mammogram for malignant neoplasm of breast: Secondary | ICD-10-CM

## 2017-10-29 ENCOUNTER — Encounter: Payer: Self-pay | Admitting: Family Medicine

## 2017-10-29 ENCOUNTER — Ambulatory Visit: Payer: 59 | Admitting: Family Medicine

## 2017-10-29 VITALS — BP 126/84 | HR 83 | Temp 98.4°F | Resp 12 | Ht 60.0 in | Wt 160.2 lb

## 2017-10-29 DIAGNOSIS — Z13220 Encounter for screening for lipoid disorders: Secondary | ICD-10-CM

## 2017-10-29 DIAGNOSIS — Z131 Encounter for screening for diabetes mellitus: Secondary | ICD-10-CM | POA: Diagnosis not present

## 2017-10-29 DIAGNOSIS — D509 Iron deficiency anemia, unspecified: Secondary | ICD-10-CM

## 2017-10-29 DIAGNOSIS — Z0000 Encounter for general adult medical examination without abnormal findings: Secondary | ICD-10-CM

## 2017-10-29 NOTE — Patient Instructions (Addendum)
A few things to remember from today's visit:   Routine general medical examination at a health care facility  Screening for lipid disorders - Plan: Lipid panel  Diabetes mellitus screening - Plan: Basic metabolic panel  Iron deficiency anemia, unspecified iron deficiency anemia type - Plan: CBC  Today you have you routine preventive visit.  At least 150 minutes of moderate exercise per week, daily brisk walking for 15-30 min is a good exercise option. Healthy diet low in saturated (animal) fats and sweets and consisting of fresh fruits and vegetables, lean meats such as fish and white chicken and whole grains.  These are some of recommendations for screening depending of age and risk factors:   - Vaccines:  Tdap vaccine every 10 years.  Shingles vaccine recommended at age 15, could be given after 43 years of age but not sure about insurance coverage.   Pneumonia vaccines:  Prevnar 13 at 65 and Pneumovax at 70. Sometimes Pneumovax is giving earlier if history of smoking, lung disease,diabetes,kidney disease among some.    Screening for diabetes at age 19 and every 3 years.  Cervical cancer prevention:   Pap smear every 3-5 years between women 33 and older if pap smear negative and HPV screening negative. Since you are going to gyn to have your Mirena changed,continue female preventive care with Dr Mancel Bale.   -Breast cancer: Mammogram: There is disagreement between experts about when to start screening in low risk asymptomatic female but recent recommendations are to start screening at 54 and not later than 43 years old , every 1-2 years and after 43 yo q 2 years. Screening is recommended until 43 years old but some women can continue screening depending of healthy issues.   Colon cancer screening: starts at 43 years old until 43 years old.  Cholesterol disorder screening at age 42 and every 3 years.  Also recommended:  1. Dental visit- Brush and floss your teeth twice  daily; visit your dentist twice a year. 2. Eye doctor- Get an eye exam at least every 2 years. 3. Helmet use- Always wear a helmet when riding a bicycle, motorcycle, rollerblading or skateboarding. 4. Safe sex- If you may be exposed to sexually transmitted infections, use a condom. 5. Seat belts- Seat belts can save your live; always wear one. 6. Smoke/Carbon Monoxide detectors- These detectors need to be installed on the appropriate level of your home. Replace batteries at least once a year. 7. Skin cancer- When out in the sun please cover up and use sunscreen 15 SPF or higher. 8. Violence- If anyone is threatening or hurting you, please tell your healthcare provider.  9. Drink alcohol in moderation- Limit alcohol intake to one drink or less per day. Never drink and drive.  Please be sure medication list is accurate. If a new problem present, please set up appointment sooner than planned today.

## 2017-10-29 NOTE — Progress Notes (Signed)
HPI:   [name] is a 42 y.o. female, who is here today to establish care.  Former PCP: N/A Last preventive routine visit: 2017-2018  Chronic medical problems: Iron def anemia, she has the Mirena IUD placed in 2014.   Concerns today:  Mirena is due for change in 02/2018. She would like to have her CPE today.  Hx of abnormal pap smear, not sure about date but > 10 years ago. She did not have colpo Bx, followed with repeating pap smear. Last pap smear in 2018,negative. She is not certain about HPV screening. Hx of fibroids, s/p myomectomy in 2014.  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 Mammogram scheduled for next week.  She does not exercise regularly and has not been consistent with a healthy diet.  No known Hx of DM,HLD, or HTN.  She lives with her fiance.  She follows with ophthalmologist annually.   Review of Systems  Constitutional: Negative for appetite change, fatigue and fever.  HENT: Negative for hearing loss, mouth sores, trouble swallowing and voice change.   Eyes: Negative for redness and visual disturbance.  Respiratory: Negative for cough, shortness of breath and wheezing.   Cardiovascular: Negative for chest pain, palpitations and leg swelling.  Gastrointestinal: Negative for abdominal pain, nausea and vomiting.       No changes in bowel habits.  Endocrine: Negative for cold intolerance, heat intolerance, polydipsia, polyphagia and polyuria.  Genitourinary: Negative for decreased urine volume, dysuria, hematuria, vaginal bleeding and vaginal discharge.  Musculoskeletal: Negative for gait problem and myalgias.  Skin: Negative for color change and rash.  Allergic/Immunologic: Negative for environmental allergies.  Neurological: Negative for syncope, weakness and headaches.  Hematological: Negative for adenopathy. Does not bruise/bleed easily.  Psychiatric/Behavioral: Negative for confusion and sleep  disturbance. The patient is not nervous/anxious.   All other systems reviewed and are negative.     Current Outpatient Medications on File Prior to Visit  Medication Sig Dispense Refill  . ibuprofen (ADVIL,MOTRIN) 600 MG tablet ibuprofen 600 mg tablet    . levonorgestrel (MIRENA, 52 MG,) 20 MCG/24HR IUD Mirena 20 mcg/24 hours (5 yrs) 52 mg intrauterine device  Take 1 device by intrauterine route.     No current facility-administered medications on file prior to visit.      Past Medical History:  Diagnosis Date  . Anemia    01/29/2013 BLOOD TRANFUSION  . Chicken pox   . History of blood transfusion    Allergies  Allergen Reactions  . Penicillins     Family History  Problem Relation Age of Onset  . Hypertension Mother   . Diabetes Father   . Hypertension Father   . Hyperlipidemia Father   . Drug abuse Father   . Arthritis Father   . Stroke Father   . Cancer Paternal Grandmother     Social History   Socioeconomic History  . Marital status: Single    Spouse name: Not on file  . Number of children: 1  . Years of education: Not on file  . Highest education level: Not on file  Occupational History  . Not on file  Social Needs  . Financial resource strain: Not on file  . Food insecurity:    Worry: Not on file    Inability: Not on file  . Transportation needs:    Medical: Not on file    Non-medical: Not on file  Tobacco Use  .  Smoking status: Never Smoker  . Smokeless tobacco: Never Used  Substance and Sexual Activity  . Alcohol use: Yes    Comment: wine once a month  . Drug use: No  . Sexual activity: Yes    Partners: Male    Birth control/protection: IUD  Lifestyle  . Physical activity:    Days per week: Not on file    Minutes per session: Not on file  . Stress: Not on file  Relationships  . Social connections:    Talks on phone: Not on file    Gets together: Not on file    Attends religious service: Not on file    Active member of club or  organization: Not on file    Attends meetings of clubs or organizations: Not on file    Relationship status: Not on file  Other Topics Concern  . Not on file  Social History Narrative  . Not on file    Vitals:   10/29/17 1428  BP: 126/84  Pulse: 83  Resp: 12  Temp: 98.4 F (36.9 C)  SpO2: 98%    Body mass index is 31.3 kg/m.   Physical Exam  Nursing note and vitals reviewed. Constitutional: She is oriented to person, place, and time. She appears well-developed. No distress.  HENT:  Head: Normocephalic and atraumatic.  Right Ear: Hearing, tympanic membrane, external ear and ear canal normal.  Left Ear: Hearing, tympanic membrane, external ear and ear canal normal.  Mouth/Throat: Uvula is midline, oropharynx is clear and moist and mucous membranes are normal.  Eyes: Pupils are equal, round, and reactive to light. Conjunctivae and EOM are normal.  Neck: No tracheal deviation present. No thyromegaly present.  Cardiovascular: Normal rate and regular rhythm.  No murmur heard. Pulses:      Dorsalis pedis pulses are 2+ on the right side, and 2+ on the left side.  Respiratory: Effort normal and breath sounds normal. No respiratory distress.  GI: Soft. She exhibits no mass. There is no hepatomegaly. There is no tenderness.  Genitourinary:  Genitourinary Comments: Deferred to gynecologist.  Musculoskeletal: She exhibits no edema.  No major deformity or signs of synovitis appreciated.  Lymphadenopathy:    She has no cervical adenopathy.       Right: No supraclavicular adenopathy present.       Left: No supraclavicular adenopathy present.  Neurological: She is alert and oriented to person, place, and time. She has normal strength. No cranial nerve deficit. Coordination and gait normal.  Reflex Scores:      Bicep reflexes are 2+ on the right side and 2+ on the left side.      Patellar reflexes are 2+ on the right side and 2+ on the left side. Skin: Skin is warm. No rash noted. No  erythema.  Psychiatric: She has a normal mood and affect. Her speech is normal.  Well groomed, good eye contact.    ASSESSMENT AND PLAN:   [name] was seen today for establish care and annual exam.  Orders Placed This Encounter  Procedures  . CBC  . Basic metabolic panel  . Lipid panel     Routine general medical examination at a health care facility  We discussed the importance of regular physical activity and healthy diet for prevention of chronic illness and/or complications. Preventive guidelines reviewed. Vaccination up to date. Fasting labs next week. She will schedule appt with Dr Mancel Bale, her gyn, for IUD change and female preventive visit/exam. Next CPE in a year.  Screening for lipid disorders -     Lipid panel; Future  Diabetes mellitus screening -     Basic metabolic panel; Future  Iron deficiency anemia, unspecified iron deficiency anemia type  Blood transfusion in 2014. Currently not on iron supplementation. S/P IUD mirena placement and fibroid removal treatment in 2014. Further recommendations will be given according to labs results.   -     CBC; Future        Harmonie Verrastro G. Martin[place], MD  [facility]. [place] [facility].

## 2017-11-03 ENCOUNTER — Other Ambulatory Visit (INDEPENDENT_AMBULATORY_CARE_PROVIDER_SITE_OTHER): Payer: 59

## 2017-11-03 DIAGNOSIS — Z13220 Encounter for screening for lipoid disorders: Secondary | ICD-10-CM | POA: Diagnosis not present

## 2017-11-03 DIAGNOSIS — D509 Iron deficiency anemia, unspecified: Secondary | ICD-10-CM

## 2017-11-03 DIAGNOSIS — Z131 Encounter for screening for diabetes mellitus: Secondary | ICD-10-CM

## 2017-11-03 LAB — LIPID PANEL
CHOL/HDL RATIO: 3
Cholesterol: 176 mg/dL (ref 0–200)
HDL: 52.7 mg/dL (ref >39.00)
LDL CALC: 110 mg/dL — AB (ref 0–99)
NONHDL: 123.77
Triglycerides: 70 mg/dL (ref 0.0–149.0)
VLDL: 14 mg/dL (ref 0.0–40.0)

## 2017-11-03 LAB — CBC
HEMATOCRIT: 40.3 % (ref 36.0–46.0)
HEMOGLOBIN: 13.8 g/dL (ref 12.0–15.0)
MCHC: 34.3 g/dL (ref 30.0–36.0)
MCV: 88 fl (ref 78.0–100.0)
PLATELETS: 256 10*3/uL (ref 150.0–400.0)
RBC: 4.58 Mil/uL (ref 3.87–5.11)
RDW: 13.2 % (ref 11.5–15.5)
WBC: 5.5 10*3/uL (ref 4.0–10.5)

## 2017-11-03 LAB — BASIC METABOLIC PANEL
BUN: 13 mg/dL (ref 6–23)
CALCIUM: 9.7 mg/dL (ref 8.4–10.5)
CHLORIDE: 103 meq/L (ref 96–112)
CO2: 27 meq/L (ref 19–32)
Creatinine, Ser: 0.71 mg/dL (ref 0.40–1.20)
GFR: 115.45 mL/min (ref >60.00)
GLUCOSE: 78 mg/dL (ref 70–99)
POTASSIUM: 4.4 meq/L (ref 3.5–5.1)
Sodium: 139 mEq/L (ref 135–145)

## 2017-11-12 ENCOUNTER — Ambulatory Visit
Admission: RE | Admit: 2017-11-12 | Discharge: 2017-11-12 | Disposition: A | Discharge status: Home / Self Care | Payer: 59 | Source: Ambulatory Visit | Attending: Family Medicine | Admitting: Family Medicine

## 2017-11-12 DIAGNOSIS — Z1231 Encounter for screening mammogram for malignant neoplasm of breast: Secondary | ICD-10-CM

## 2017-11-13 ENCOUNTER — Other Ambulatory Visit: Payer: Self-pay | Admitting: Family Medicine

## 2017-11-13 DIAGNOSIS — R928 Other abnormal and inconclusive findings on diagnostic imaging of breast: Secondary | ICD-10-CM

## 2017-11-18 ENCOUNTER — Ambulatory Visit
Admission: RE | Admit: 2017-11-18 | Discharge: 2017-11-18 | Disposition: A | Discharge status: Home / Self Care | Payer: 59 | Source: Ambulatory Visit | Attending: Family Medicine | Admitting: Family Medicine

## 2017-11-18 ENCOUNTER — Other Ambulatory Visit: Payer: Self-pay | Admitting: Family Medicine

## 2017-11-18 DIAGNOSIS — R928 Other abnormal and inconclusive findings on diagnostic imaging of breast: Secondary | ICD-10-CM

## 2017-11-18 DIAGNOSIS — R921 Mammographic calcification found on diagnostic imaging of breast: Secondary | ICD-10-CM

## 2017-11-20 ENCOUNTER — Ambulatory Visit
Admission: RE | Admit: 2017-11-20 | Discharge: 2017-11-20 | Disposition: A | Discharge status: Home / Self Care | Payer: 59 | Source: Ambulatory Visit | Attending: Family Medicine | Admitting: Family Medicine

## 2017-11-20 ENCOUNTER — Other Ambulatory Visit: Payer: Self-pay | Admitting: Family Medicine

## 2017-11-20 DIAGNOSIS — R921 Mammographic calcification found on diagnostic imaging of breast: Secondary | ICD-10-CM

## 2017-12-01 ENCOUNTER — Encounter (HOSPITAL_COMMUNITY): Payer: Self-pay | Admitting: Family Medicine

## 2017-12-01 ENCOUNTER — Ambulatory Visit (HOSPITAL_COMMUNITY)
Admission: EM | Admit: 2017-12-01 | Discharge: 2017-12-01 | Disposition: A | Discharge status: Home / Self Care | Payer: 59 | Attending: Family Medicine | Admitting: Family Medicine

## 2017-12-01 DIAGNOSIS — L0291 Cutaneous abscess, unspecified: Secondary | ICD-10-CM

## 2017-12-01 MED ORDER — SULFAMETHOXAZOLE-TRIMETHOPRIM 800-160 MG PO TABS: 1.0000 | ORAL_TABLET | Freq: Two times a day (BID) | ORAL | 0 refills | Status: AC

## 2017-12-01 NOTE — ED Triage Notes (Signed)
Pt here for abscess to buttocks area x 1 week. sts that it is not draining.

## 2017-12-01 NOTE — ED Provider Notes (Signed)
[facility]    CSN: [id number] Arrival date & time: 12/01/17  1334     History   Chief Complaint Chief Complaint  Patient presents with  . Abscess    HPI Madison Robinson is a 42 y.o. female.   HPI  Patient is here for an abscess.  It is on her right buttock.  She can hardly sit today.  Is very painful.  She has tried warm compresses.  She tried a drawing salve.  In spite of this her abscess is growing.  She had no fever chills.  No diabetes.  No history of recurrent abscesses. Patient is an Therapist, sports in the dialysis unit No other significant medical problems to date  Past Medical History:  Diagnosis Date  . Anemia    01/29/2013 BLOOD TRANFUSION  . Chicken pox   . History of blood transfusion     Patient Active Problem List   Diagnosis Date Noted  . Menorrhagia 01/29/2013  . Anemia 01/29/2013    Past Surgical History:  Procedure Laterality Date  . DILATATION & CURRETTAGE/HYSTEROSCOPY WITH RESECTOCOPE N/A 03/17/2013   Procedure: DILATATION & CURETTAGE/HYSTEROSCOPY WITH RESECTOCOPE;  Surgeon: Delice Lesch, MD;  Location: [facility];  Service: Gynecology;  Laterality: N/A;    OB History   None      Home Medications    Prior to Admission medications   Medication Sig Start Date End Date Taking? Authorizing Provider  ibuprofen (ADVIL,MOTRIN) 600 MG tablet ibuprofen 600 mg tablet    [provider]  levonorgestrel (MIRENA, 52 MG,) 20 MCG/24HR IUD Mirena 20 mcg/24 hours (5 yrs) 52 mg intrauterine device  Take 1 device by intrauterine route.    [provider]  sulfamethoxazole-trimethoprim (BACTRIM DS,SEPTRA DS) 800-160 MG tablet Take 1 tablet by mouth 2 (two) times daily for 7 days. 12/01/17 12/08/17  Raylene Everts, MD    Family History Family History  Problem Relation Age of Onset  . Hypertension Mother   . Diabetes Father   . Hypertension Father   . Hyperlipidemia Father   . Drug abuse Father   . Arthritis Father   . Stroke  Father   . Cancer Paternal Grandmother   . Breast cancer Paternal Aunt     Social History Social History   Tobacco Use  . Smoking status: Never Smoker  . Smokeless tobacco: Never Used  Substance Use Topics  . Alcohol use: Yes    Comment: wine once a month  . Drug use: No     Allergies   Penicillins   Review of Systems Review of Systems  Constitutional: Negative for chills and fever.  HENT: Negative for ear pain and sore throat.   Eyes: Negative for pain and visual disturbance.  Respiratory: Negative for cough and shortness of breath.   Cardiovascular: Negative for chest pain and palpitations.  Gastrointestinal: Negative for abdominal pain and vomiting.  Genitourinary: Negative for dysuria and hematuria.  Musculoskeletal: Negative for arthralgias and back pain.  Skin: Positive for wound. Negative for color change and rash.  Neurological: Negative for seizures and syncope.  All other systems reviewed and are negative.    Physical Exam Triage Vital Signs ED Triage Vitals  Enc Vitals Group     BP 12/01/17 1420 116/62     Pulse Rate 12/01/17 1420 84     Resp 12/01/17 1420 18     Temp 12/01/17 1420 98.6 F (37 C)     Temp src --  SpO2 12/01/17 1420 100 %     Weight --      Height --      Head Circumference --      Peak Flow --      Pain Score 12/01/17 1418 10     Pain Loc --      Pain Edu? --      Excl. in [place]? --    No data found.  Updated Vital Signs BP 116/62   Pulse 84   Temp 98.6 F (37 C)   Resp 18   SpO2 100%      Physical Exam  Constitutional: She appears well-developed and well-nourished. She appears distressed.  Appears uncomfortable sitting on left buttock  HENT:  Head: Normocephalic and atraumatic.  Eyes: Conjunctivae are normal.  Neck: Neck supple.  Cardiovascular: Normal rate and regular rhythm.  No murmur heard. Pulmonary/Chest: Effort normal and breath sounds normal. No respiratory distress.  Musculoskeletal: She exhibits no  edema.       Legs: Neurological: She is alert.  Skin: Skin is warm and dry.  Abscess on the right buttock as illustrated.  4 to 5 cm of induration, very tender.  No overlying redness of skin.  Fluctuance in center palpable.  Psychiatric: She has a normal mood and affect.  Nursing note and vitals reviewed.    UC Treatments / Results  Labs (all labs ordered are listed, but only abnormal results are displayed) Labs Reviewed - No data to display  EKG None  Radiology No results found.  Procedures Incision and Drainage Date/Time: 12/01/2017 6:56 PM Performed by: Raylene Everts, MD Authorized by: Raylene Everts, MD   Consent:    Consent obtained:  Verbal   Consent given by:  Patient   Risks discussed:  Bleeding, incomplete drainage, infection and pain   Alternatives discussed:  No treatment and delayed treatment Location:    Type:  Abscess   Size:  4cm Pre-procedure details:    Skin preparation:  Antiseptic wash and Betadine Anesthesia (see MAR for exact dosages):    Anesthesia method:  Local infiltration   Local anesthetic:  Lidocaine 2% w/o epi Procedure type:    Complexity:  Simple Procedure details:    Needle aspiration: no     Incision types:  Stab incision   Incision depth:  Subcutaneous   Scalpel blade:  11   Wound management:  Probed and deloculated and irrigated with saline   Drainage:  Purulent   Drainage amount:  Copious   Wound treatment:  Drain placed   Packing materials:  1/2 in gauze   Amount 1/2":  2 in Post-procedure details:    Patient tolerance of procedure:  Tolerated well, no immediate complications Comments:     Very malodorous purulence   (including critical care time)  Medications Ordered in UC Medications - No data to display  Initial Impression / Assessment and Plan / UC Course  I have reviewed the triage vital signs and the nursing notes.  Pertinent labs & imaging results that were available during my care of the patient  were reviewed by me and considered in my medical decision making (see chart for details).     Waiting or not treating discussed .  Patient wants I and D to improve pain and reocvery Final Clinical Impressions(s) / UC Diagnoses   Final diagnoses:  Abscess     Discharge Instructions     Allow area to drain Leave gauze wick in place a couple of days  Take the antibiotic 2 x a day Return for problems   ED Prescriptions    Medication Sig Dispense Auth. Provider   sulfamethoxazole-trimethoprim (BACTRIM DS,SEPTRA DS) 800-160 MG tablet Take 1 tablet by mouth 2 (two) times daily for 7 days. 14 tablet Raylene Everts, MD     Controlled Substance Prescriptions [place] Controlled Substance Registry consulted? Not Applicable   Raylene Everts, MD 12/01/17 [phone]

## 2017-12-01 NOTE — Discharge Instructions (Signed)
Allow area to drain Leave gauze wick in place a couple of days Take the antibiotic 2 x a day Return for problems

## 2017-12-10 ENCOUNTER — Encounter: Payer: Self-pay | Admitting: Internal Medicine

## 2017-12-10 ENCOUNTER — Ambulatory Visit: Payer: 59 | Admitting: Internal Medicine

## 2017-12-10 ENCOUNTER — Encounter: Payer: Self-pay | Admitting: *Deleted

## 2017-12-10 VITALS — BP 98/28 | HR 91 | Temp 98.3°F | Wt 156.1 lb

## 2017-12-10 DIAGNOSIS — Z87898 Personal history of other specified conditions: Secondary | ICD-10-CM | POA: Diagnosis not present

## 2017-12-10 DIAGNOSIS — R945 Abnormal results of liver function studies: Secondary | ICD-10-CM

## 2017-12-10 DIAGNOSIS — L270 Generalized skin eruption due to drugs and medicaments taken internally: Secondary | ICD-10-CM

## 2017-12-10 DIAGNOSIS — R21 Rash and other nonspecific skin eruption: Secondary | ICD-10-CM

## 2017-12-10 LAB — POC URINALSYSI DIPSTICK (AUTOMATED)
Glucose, UA: NEGATIVE
NITRITE UA: NEGATIVE
PH UA: 6 (ref 5.0–8.0)
PROTEIN UA: POSITIVE — AB
Spec Grav, UA: >=1.03 — AB (ref 1.010–1.025)
Urobilinogen, UA: 1 E.U./dL

## 2017-12-10 LAB — CBC WITH DIFFERENTIAL/PLATELET
BASOS ABS: 0 10*3/uL (ref 0.0–0.1)
Basophils Relative: 0.3 % (ref 0.0–3.0)
EOS ABS: 0.3 10*3/uL (ref 0.0–0.7)
Eosinophils Relative: 7.9 % — ABNORMAL HIGH (ref 0.0–5.0)
HCT: 37.9 % (ref 36.0–46.0)
Hemoglobin: 12.9 g/dL (ref 12.0–15.0)
LYMPHS ABS: 1.1 10*3/uL (ref 0.7–4.0)
Lymphocytes Relative: 32 % (ref 12.0–46.0)
MCHC: 34 g/dL (ref 30.0–36.0)
MCV: 87.1 fl (ref 78.0–100.0)
MONOS PCT: 10.9 % (ref 3.0–12.0)
Monocytes Absolute: 0.4 10*3/uL (ref 0.1–1.0)
NEUTROS ABS: 1.6 10*3/uL (ref 1.4–7.7)
Neutrophils Relative %: 48.9 % (ref 43.0–77.0)
PLATELETS: 204 10*3/uL (ref 150.0–400.0)
RBC: 4.36 Mil/uL (ref 3.87–5.11)
RDW: 12.9 % (ref 11.5–15.5)
WBC: 3.3 10*3/uL — ABNORMAL LOW (ref 4.0–10.5)

## 2017-12-10 LAB — COMPREHENSIVE METABOLIC PANEL
ALK PHOS: 53 U/L (ref 39–117)
ALT: 75 U/L — AB (ref 0–35)
AST: 82 U/L — AB (ref 0–37)
Albumin: 4.1 g/dL (ref 3.5–5.2)
BILIRUBIN TOTAL: 0.5 mg/dL (ref 0.2–1.2)
BUN: 10 mg/dL (ref 6–23)
CO2: 28 meq/L (ref 19–32)
CREATININE: 0.71 mg/dL (ref 0.40–1.20)
Calcium: 8.9 mg/dL (ref 8.4–10.5)
Chloride: 101 mEq/L (ref 96–112)
GFR: 115.39 mL/min (ref >60.00)
GLUCOSE: 81 mg/dL (ref 70–99)
Potassium: 3.6 mEq/L (ref 3.5–5.1)
Sodium: 137 mEq/L (ref 135–145)
TOTAL PROTEIN: 7.1 g/dL (ref 6.0–8.3)

## 2017-12-10 LAB — C-REACTIVE PROTEIN: CRP: 2 mg/dL (ref 0.5–20.0)

## 2017-12-10 LAB — SEDIMENTATION RATE: Sed Rate: 30 mm/hr — ABNORMAL HIGH (ref 0–20)

## 2017-12-10 MED ORDER — PREDNISONE 20 MG PO TABS: ORAL_TABLET | ORAL | 0 refills | Status: DC

## 2017-12-10 NOTE — Patient Instructions (Addendum)
This is most likely a drug rash   Can use antihistamine .  Benadryl 25 mg every 6 hours and or zyrtec   twice a day   Round the clock today and if  progressive  Blisters or mouth lesions or fever then we may ned to take prednisone  Course     Urine has   1 + leuk   And blood that could be from  the period   Antihistamine.    As soon as possible .   To relieve the itching and can add prednisone.

## 2017-12-10 NOTE — Progress Notes (Signed)
Chief Complaint  Patient presents with  . Rash    Rash total body. fever and chills x almost 2 week. Pt noticed a boil on her inner left buttocks, seen by urgent care and was lanced, given abx x 7 days. Temp has been low grade. Pt has been taking Tylenol and Ibuprofen. Pt notes having urinary frequency on 12/06/17 and took AZO for sx's. Pt states body aches started on 12/07/17. Pt states that he rash popped up this morning and is getting worse; tingly and itching.     HPI: Madison Robinson 42 y.o. come in f for SDA   PCP appt NA    Pat repirts and   Records show seen in ed  5 13 for abscess   An given  Septra ds  For 7 days   ia nd d and area has done well .. finished the bactrim 2 days ago .  Had 1 day of  UT sx resolved  And now on first day  Beginning to start menses   Has had low grade temp  Until aslt night and was better   No joint pain or swelling but this am awoke with  Itchy rash all over .  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  No diabetes or other boils etc.  ROS: See pertinent positives and negatives per HPI. No current utis sx vomiting eye sx sore throat   Past Medical History:  Diagnosis Date  . Anemia    01/29/2013 BLOOD TRANFUSION  . Chicken pox   . History of blood transfusion     Family History  Problem Relation Age of Onset  . Hypertension Mother   . Diabetes Father   . Hypertension Father   . Hyperlipidemia Father   . Drug abuse Father   . Arthritis Father   . Stroke Father   . Cancer Paternal Grandmother   . Breast cancer Paternal Aunt     Social History   Socioeconomic History  . Marital status: Single    Spouse name: Not on file  . Number of children: 1  . Years of education: Not on file  . Highest education level: Not on file  Occupational History  . Not on file  Social Needs  .  Financial resource strain: Not on file  . Food insecurity:    Worry: Not on file    Inability: Not on file  . Transportation needs:    Medical: Not on file    Non-medical: Not on file  Tobacco Use  . Smoking status: Never Smoker  . Smokeless tobacco: Never Used  Substance and Sexual Activity  . Alcohol use: Yes    Comment: wine once a month  . Drug use: No  . Sexual activity: Yes    Partners: Male    Birth control/protection: IUD  Lifestyle  . Physical activity:    Days per week: Not on file    Minutes per session: Not on file  . Stress: Not on file  Relationships  . Social connections:    Talks on phone: Not on file    Gets together: Not on file    Attends religious service: Not on file    Active member of club or organization: Not on file  Attends meetings of clubs or organizations: Not on file    Relationship status: Not on file  Other Topics Concern  . Not on file  Social History Narrative  . Not on file    Outpatient Medications Prior to Visit  Medication Sig Dispense Refill  . ibuprofen (ADVIL,MOTRIN) 600 MG tablet ibuprofen 600 mg tablet    . levonorgestrel (MIRENA, 52 MG,) 20 MCG/24HR IUD Mirena 20 mcg/24 hours (5 yrs) 52 mg intrauterine device  Take 1 device by intrauterine route.     No facility-administered medications prior to visit.      EXAM:  BP (!) 98/28 (BP Location: Left Arm, Patient Position: Sitting, Cuff Size: Normal)   Pulse 91   Temp 98.3 F (36.8 C) (Oral)   Wt 156 lb 1.6 oz (70.8 kg)   BMI 30.49 kg/m   Body mass index is 30.49 kg/m.  GENERAL: vitals reviewed and listed above, alert, oriented, appears well hydrated and in no acute distress obvious  Rash  On extremities and trunk   Non toxic  HEENT: atraumatic, conjunctiva  clear, no obvious abnormalities on inspection of external nose and earstmx clear  OP : no lesion edema or exudate  No rash  NECK: no obvious masses on inspection palpation  LUNGS: clear to auscultation  bilaterally, no wheezes, rales or rhonchi, good air movement CV: HRRR, no clubbing cyanosis or  peripheral edema nl cap refill  Abdomen:  Sof,t normal bowel sounds without hepatosplenomegaly, no guarding rebound or masses no CVA tenderness MS: moves all extremities without noticeable focal  Abnormality SKIN:  palmare erythema   Bilaterally  And morbiliform rash   extremities and trunk   No vesicles or blisters  Or  nodules  Joints no acute swelling  Effusion  PSYCH: pleasant and cooperative, no obvious depression or anxiety      Lab Results  Component Value Date   WBC 3.3 (L) 12/10/2017   HGB 12.9 12/10/2017   HCT 37.9 12/10/2017   PLT 204.0 12/10/2017   GLUCOSE 81 12/10/2017   CHOL 176 11/03/2017   TRIG 70.0 11/03/2017   HDL 52.70 11/03/2017   LDLCALC 110 (H) 11/03/2017   ALT 75 (H) 12/10/2017   AST 82 (H) 12/10/2017   NA 137 12/10/2017   K 3.6 12/10/2017   CL 101 12/10/2017   CREATININE 0.71 12/10/2017   BUN 10 12/10/2017   CO2 28 12/10/2017   BP Readings from Last 3 Encounters:  12/10/17 (!) 98/28  12/01/17 116/62  10/29/17 126/84    ASSESSMENT AND PLAN:  Discussed the following assessment and plan:  Drug rash - Plan: POCT Urinalysis Dipstick (Automated), CBC with Differential/Platelet, CMP, Sedimentation rate, C-reactive protein  Rash - Plan: POCT Urinalysis Dipstick (Automated), CBC with Differential/Platelet, CMP, Sedimentation rate, C-reactive protein  History of fever - Plan: POCT Urinalysis Dipstick (Automated), CBC with Differential/Platelet, CMP, Sedimentation rate, C-reactive protein  Abnormal liver function - Plan: Hepatitis C antibody, Hep B Surface Antigen, Hep B Surface Antigen, Hepatitis C antibody Allergic appearing rash   Sounds like the low grade temp is gone  And no other obv infection ( ua   On beginning of menses and no uti sx )   Presumed sulfa rash    And at this time no  Systemic   Sx ?     Expectant management. For these  Begin  antihistamines   patient perfers to also  be gin on steroids at this time      And reasonable  Risk benefit of medication discussed.    To seek  Emergent care if  Alarm sx resp mucosal or blistering rash  I don't think she is contagious and when better can go back to work  But awaiting labs  -Patient advised to return or notify health care team  if  new concerns arise.  Patient Instructions  This is most likely a drug rash   Can use antihistamine .  Benadryl 25 mg every 6 hours and or zyrtec   twice a day   Round the clock today and if  progressive  Blisters or mouth lesions or fever then we may ned to take prednisone  Course     Urine has   1 + leuk   And blood that could be from  the period   Antihistamine.    As soon as possible .   To relieve the itching and can add prednisone.    Standley Brooking. Vernelle Wisner M.D.

## 2017-12-11 LAB — HEPATITIS B SURFACE ANTIGEN: HEP B S AG: NONREACTIVE

## 2017-12-11 LAB — HEPATITIS C ANTIBODY
HEP C AB: NONREACTIVE
SIGNAL TO CUT-OFF: 0.08 (ref <1.00)

## 2018-04-06 ENCOUNTER — Ambulatory Visit: Payer: 59 | Admitting: Family Medicine

## 2018-04-06 ENCOUNTER — Encounter: Payer: Self-pay | Admitting: Family Medicine

## 2018-04-06 VITALS — BP 120/70 | HR 88 | Temp 98.8°F | Resp 12 | Ht 60.0 in | Wt 168.1 lb

## 2018-04-06 DIAGNOSIS — L508 Other urticaria: Secondary | ICD-10-CM

## 2018-04-06 MED ORDER — PREDNISONE 20 MG PO TABS: ORAL_TABLET | ORAL | 0 refills | Status: AC

## 2018-04-06 MED ORDER — METHYLPREDNISOLONE ACETATE 40 MG/ML IJ SUSP
40.0000 mg | Freq: Once | INTRAMUSCULAR | Status: AC
  Administered 2018-04-06: 40 mg via INTRAMUSCULAR

## 2018-04-06 NOTE — Patient Instructions (Addendum)
A few things to remember from today's visit:   Urticaria, acute - Plan: predniSONE (DELTASONE) 20 MG tablet  Start taking prednisone tomorrow with breakfast. Zyrtec 10 mg twice per day for 7 to 10 days.  Today you received here in the office Depo-Medrol 40 mg.  Please be sure medication list is accurate. If a new problem present, please set up appointment sooner than planned today.

## 2018-04-06 NOTE — Progress Notes (Signed)
ACUTE VISIT   HPI:  Chief Complaint  Patient presents with  . Allergic Reaction    started Saturday    [name] is a 42 y.o. female, who is here today complaining of pruritic, erythematous rash on face and periocular "puffiness." Symptoms started 2 days ago while she was in [place].  She has had similar symptoms in the past, known this year, usually oral prednisone has helped. She is not sure about exacerbating factors. She is taking Benadryl, which has helped.  Negative for any new medication, detergent, soap, or body product. No known insect bite or outdoor exposures to plants. No sick contact.   Negative for oral lesions/edema,cough, wheezing, dyspnea, abdominal pain, nausea, or vomiting.  In general symptoms have improved.   Review of Systems  Constitutional: Negative for chills, fatigue and fever.  HENT: Negative for congestion, mouth sores, sore throat, trouble swallowing and voice change.   Eyes: Negative for photophobia, pain, discharge and redness.  Respiratory: Negative for cough, shortness of breath and wheezing.   Cardiovascular: Negative for chest pain, palpitations and leg swelling.  Gastrointestinal: Negative for abdominal pain, diarrhea, nausea and vomiting.  Musculoskeletal: Negative for joint swelling and myalgias.  Skin: Positive for rash.  Allergic/Immunologic: Negative for environmental allergies and food allergies.  Neurological: Negative for syncope, weakness and headaches.  Hematological: Positive for adenopathy. Does not bruise/bleed easily.      Current Outpatient Medications on File Prior to Visit  Medication Sig Dispense Refill  . levonorgestrel (MIRENA, 52 MG,) 20 MCG/24HR IUD Mirena 20 mcg/24 hours (5 yrs) 52 mg intrauterine device  Take 1 device by intrauterine route.     No current facility-administered medications on file prior to visit.      Past Medical History:  Diagnosis Date  . Anemia    01/29/2013  BLOOD TRANFUSION  . Chicken pox   . History of blood transfusion    Allergies  Allergen Reactions  . Bactrim [Sulfamethoxazole-Trimethoprim] Rash    Assumed from med off antibiotic for 2 day then itchy drug looking rash  . Penicillins     Social History   Socioeconomic History  . Marital status: Single    Spouse name: Not on file  . Number of children: 1  . Years of education: Not on file  . Highest education level: Not on file  Occupational History  . Not on file  Social Needs  . Financial resource strain: Not on file  . Food insecurity:    Worry: Not on file    Inability: Not on file  . Transportation needs:    Medical: Not on file    Non-medical: Not on file  Tobacco Use  . Smoking status: Never Smoker  . Smokeless tobacco: Never Used  Substance and Sexual Activity  . Alcohol use: Yes    Comment: wine once a month  . Drug use: No  . Sexual activity: Yes    Partners: Male    Birth control/protection: IUD  Lifestyle  . Physical activity:    Days per week: Not on file    Minutes per session: Not on file  . Stress: Not on file  Relationships  . Social connections:    Talks on phone: Not on file    Gets together: Not on file    Attends religious service: Not on file    Active member of club or organization: Not on file    Attends meetings of clubs or organizations: Not on file  Relationship status: Not on file  Other Topics Concern  . Not on file  Social History Narrative  . Not on file    Vitals:   04/06/18 1020  BP: 120/70  Pulse: 88  Resp: 12  Temp: 98.8 F (37.1 C)  SpO2: 98%   Body mass index is 32.83 kg/m.   Physical Exam  Constitutional: She is oriented to person, place, and time. She appears well-developed. No distress.  HENT:  Head: Atraumatic.  Mouth/Throat: Oropharynx is clear and moist and mucous membranes are normal.  Eyes: Conjunctivae are normal.  Neck: No muscular tenderness present. No edema and no erythema present.    Cardiovascular: Normal rate and regular rhythm.  No murmur heard. Respiratory: Effort normal and breath sounds normal. No respiratory distress.  Musculoskeletal: She exhibits no edema.  Lymphadenopathy:       Head (right side): Preauricular (6 mm,tender) adenopathy present. No posterior auricular adenopathy present.       Head (left side): No preauricular and no posterior auricular adenopathy present.    She has no cervical adenopathy.  Neurological: She is alert and oriented to person, place, and time. She has normal strength. Gait normal.  Skin: Skin is warm. Rash noted. Rash is maculopapular. Rash is not pustular and not vesicular.  Maculopapular erythematous rash on forehead,temples, and anterior to ears R>L.  A few non erythematous papular lesions around mouth. Bilateral palpebral edema, mild.   Psychiatric: She has a normal mood and affect.  Well groomed, good eye contact.    ASSESSMENT AND PLAN:   [name] was seen today for allergic reaction.  Diagnoses and all orders for this visit:  Urticaria, acute -     predniSONE (DELTASONE) 20 MG tablet; 3 tabs for 2 days, 2 tabs for 3 days, 1 tabs for 3 days, and 1/2 tab for 3 days. Take tables together with breakfast. -     methylPREDNISolone acetate (DEPO-MEDROL) injection 40 mg  It seems like this is a recurrent problem. She has not identified exacerbating factors. This episode has improved with Benadryl. Here in the office after verbal consent she received Depo-Medrol 40 mg IM. She will continue tomorrow with oral prednisone taper. We discussed side effects of prednisone. Zyrtec 10 mg twice daily for 7 to 10 days may also help.  Recommend continuing monitoring right retroauricular adenopathy.  Clearly instructed about warning signs. Follow-up as needed.     Return if symptoms worsen or fail to improve.      Sani Madariaga G. [place], MD  [facility]. [place] office.

## 2018-09-10 IMAGING — MG STEREOTACTIC CORE NEEDLE BIOPSY
4 series · 4 of 4 positions shown · non-contrast
Comparison: Previous exams.

ADDENDUM:
Pathology revealed FIBROCYSTIC CHANGES WITH CALCIFICATIONS of LEFT
breast, outer lower quadrant. This was found to be concordant by Dr.
Labelle Salha Brack Tiger.

Pathology results were discussed with the patient by telephone. The
patient reported doing well after the biopsy with tenderness at the
site. Post biopsy instructions and care were reviewed and questions
were answered. The patient was encouraged to call The [REDACTED]
The patient was instructed to return for annual screening
mammography and informed a reminder notice would be sent regarding
this appointment.
Pathology results reported by Mitali De Armas, RN on 11/21/2017.
CLINICAL DATA: Patient presents for stereotactic core needle biopsy
of a group of indeterminate microcalcifications over the slightly
outer lower left breast.
EXAM:
LEFT BREAST STEREOTACTIC CORE NEEDLE BIOPSY

[L (1 of 4)]
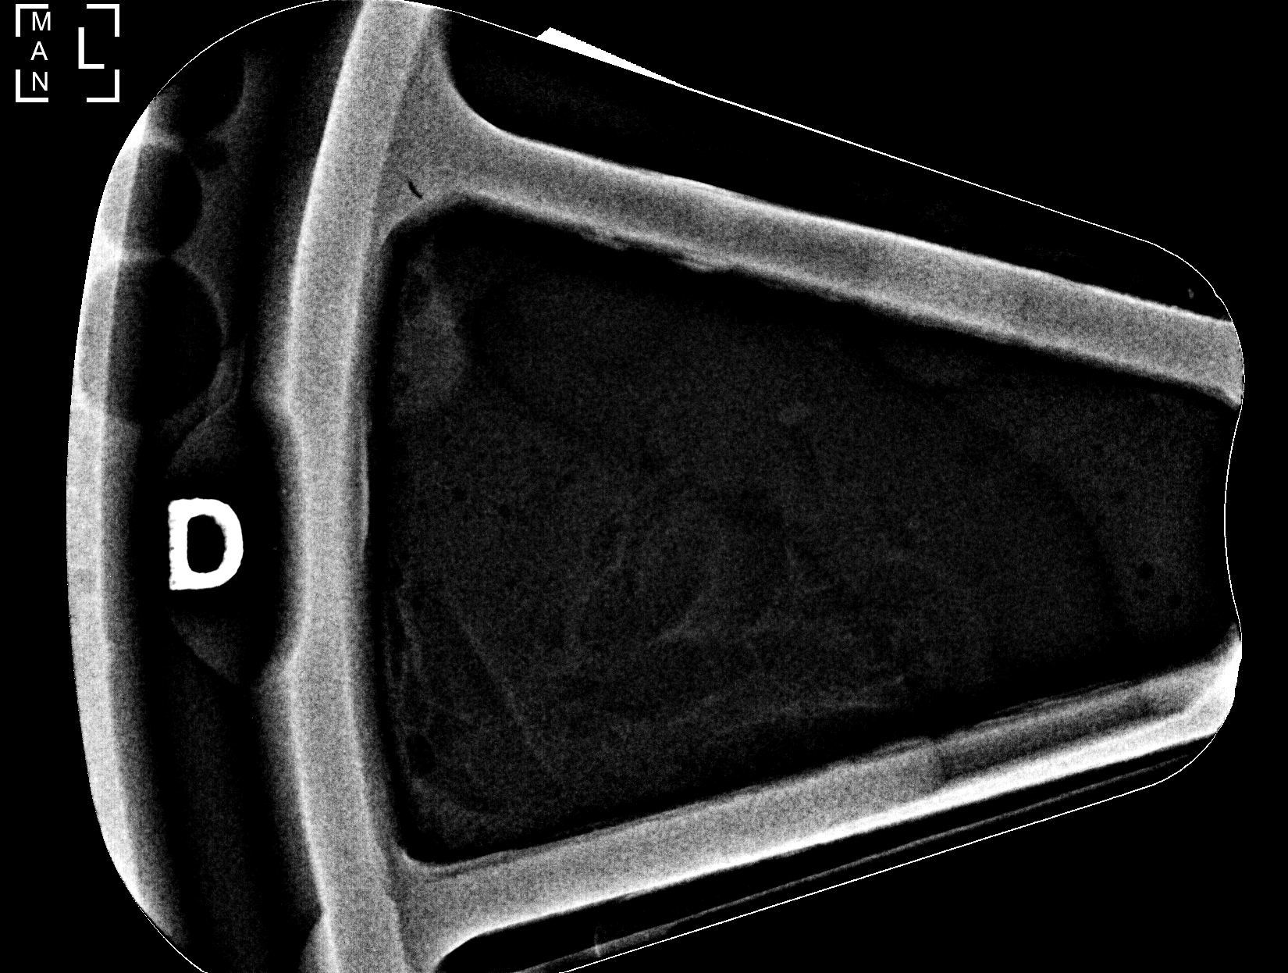

[L (2 of 4)]
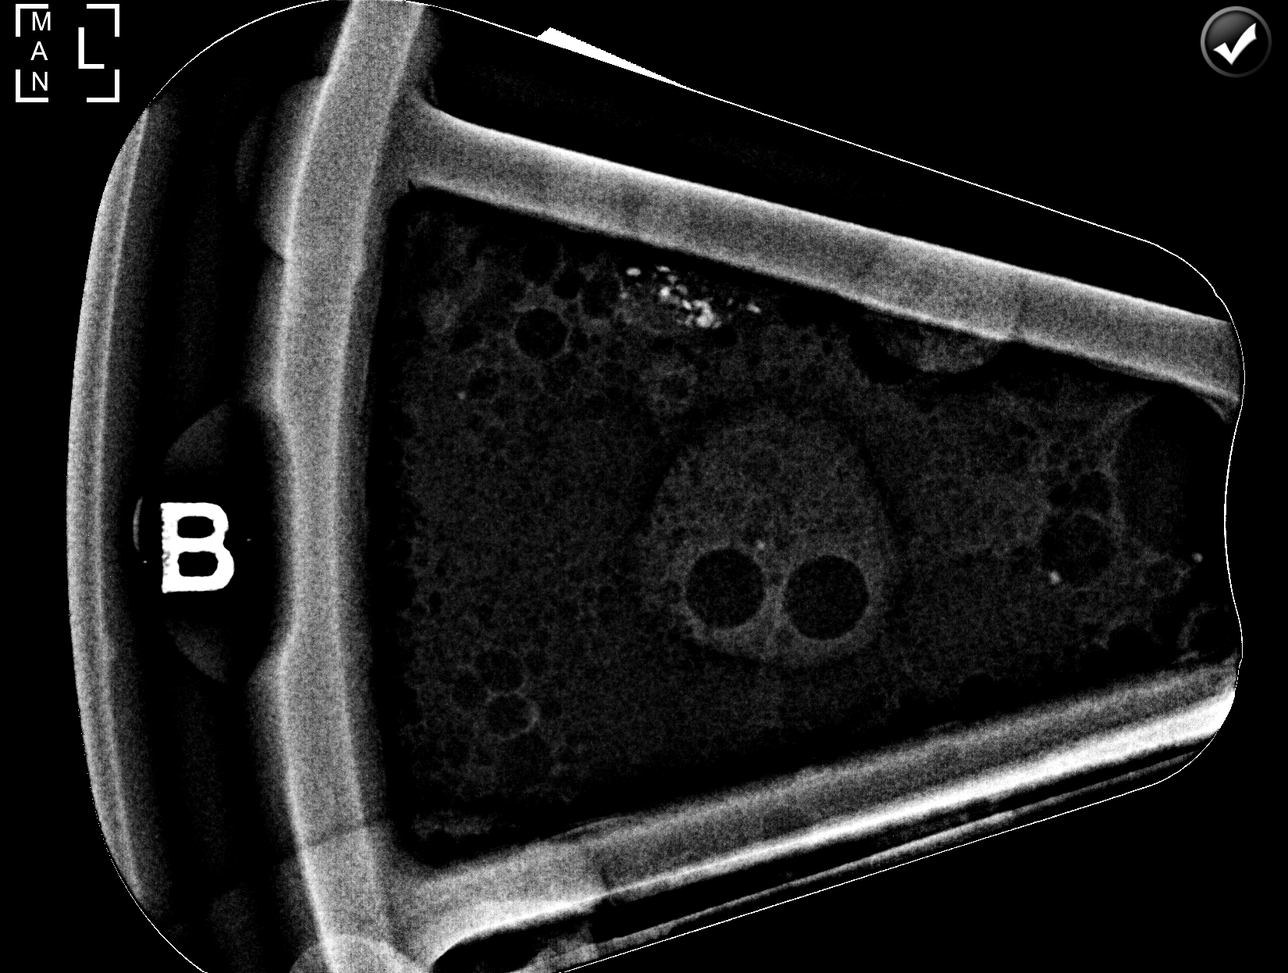

[L (3 of 4)]
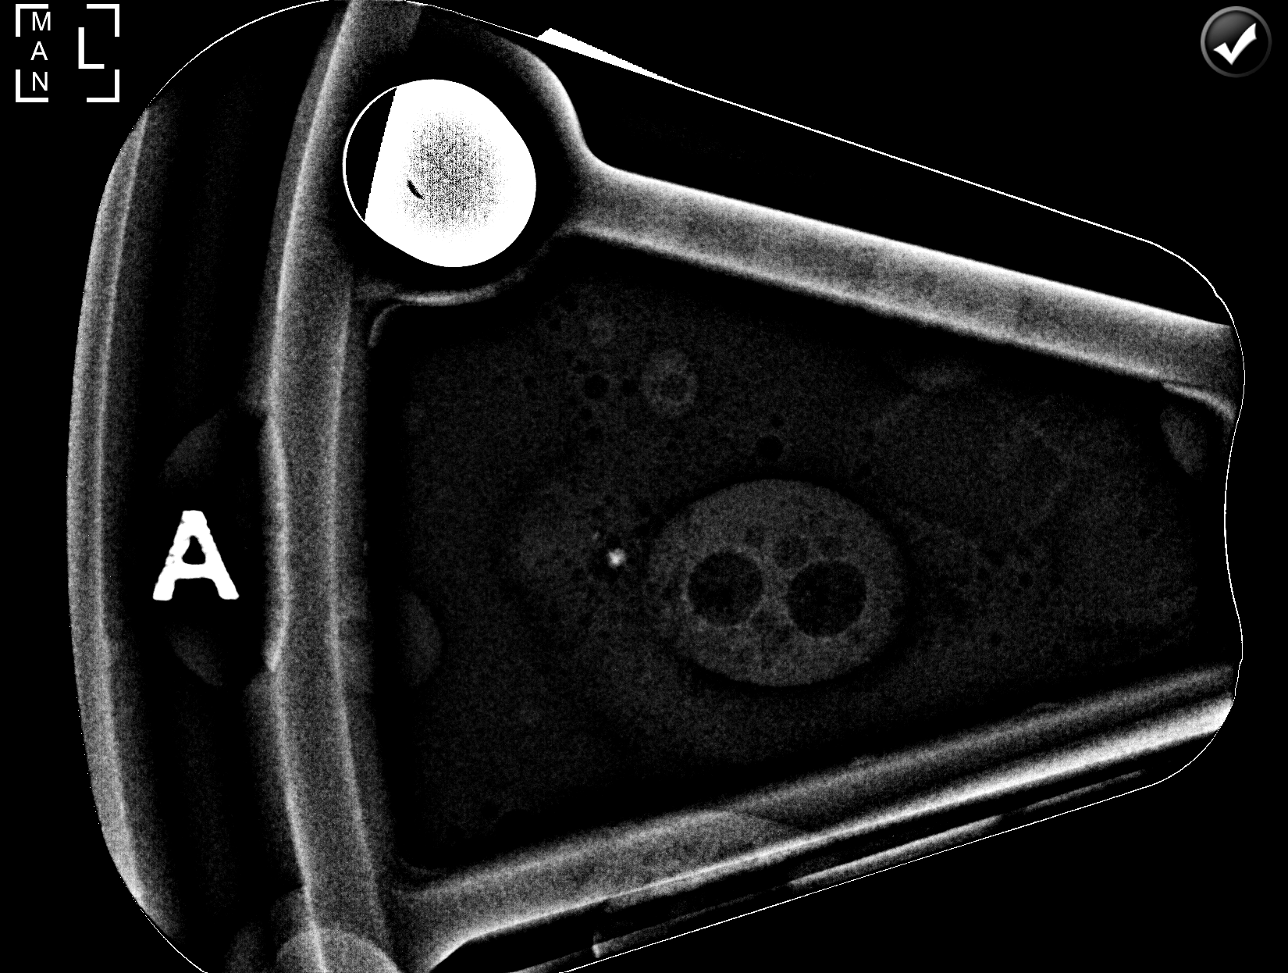

[L (4 of 4)]
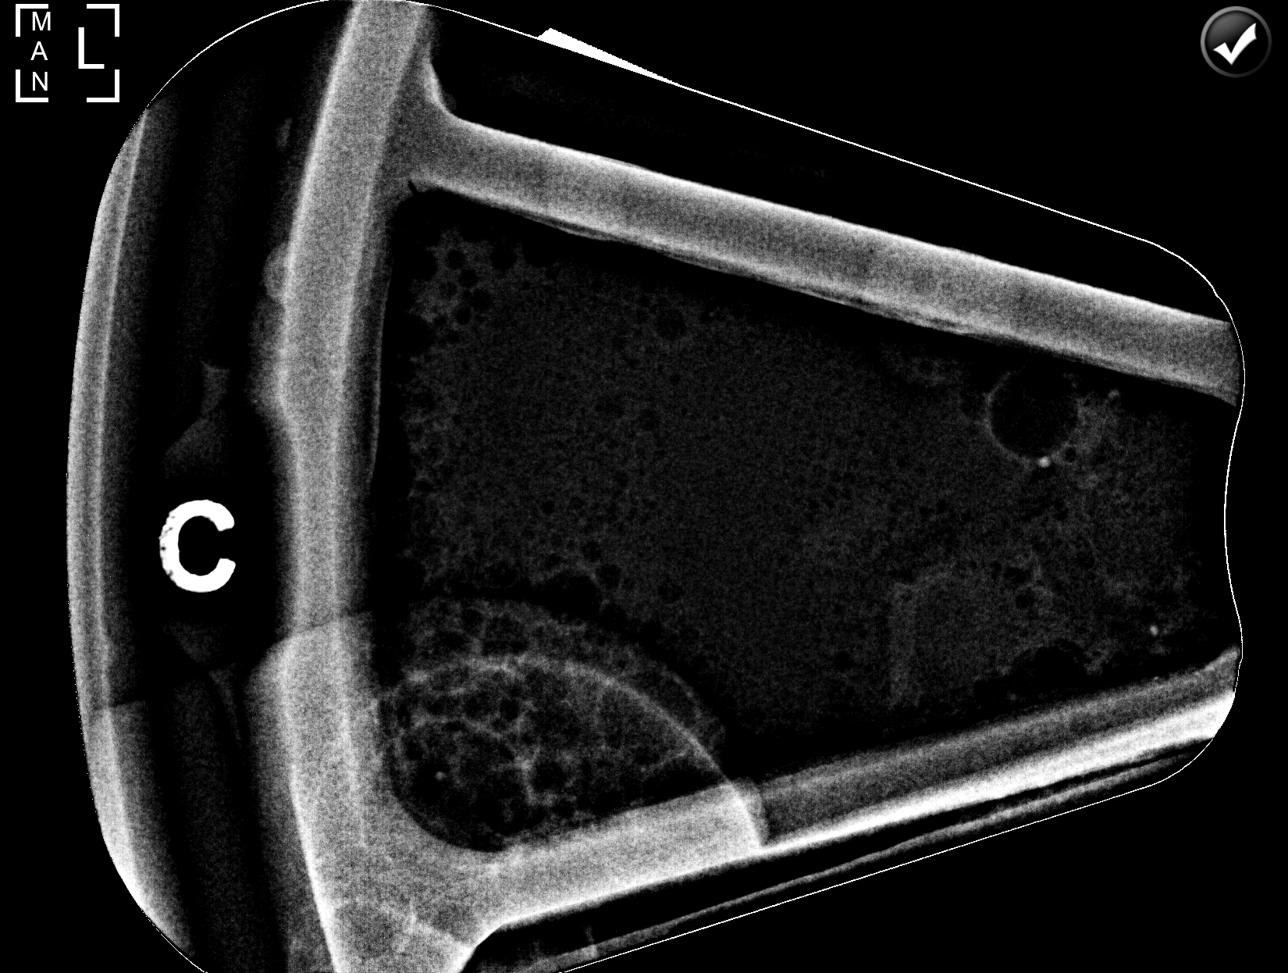

[4 of 4 positions shown; findings below may reference images not displayed]



Using sterile technique and 1% Lidocaine as local anesthetic, under
stereotactic guidance, a 9 gauge vacuum assisted device was used to
perform core needle biopsy of the targeted microcalcifications over
the outer lower left breast using a lateral to medial approach. Four
tissue specimens were obtained. Specimen radiograph was performed
showing the targeted microcalcifications. Specimens with
calcifications are identified for pathology.

Lesion quadrant: Left outer lower quadrant.

At the conclusion of the procedure, a coil shaped tissue marker clip
was deployed into the biopsy cavity. Follow-up 2-view mammogram was
performed and dictated separately.
IMPRESSION: Stereotactic-guided biopsy of left breast microcalcifications. No
apparent complications.

## 2020-06-09 ENCOUNTER — Encounter: Payer: 59 | Admitting: Family Medicine

## 2020-06-19 ENCOUNTER — Encounter: Payer: Self-pay | Admitting: Family Medicine

## 2020-06-19 ENCOUNTER — Ambulatory Visit (INDEPENDENT_AMBULATORY_CARE_PROVIDER_SITE_OTHER): Payer: 59

## 2020-06-19 ENCOUNTER — Ambulatory Visit (INDEPENDENT_AMBULATORY_CARE_PROVIDER_SITE_OTHER): Payer: 59 | Admitting: Family Medicine

## 2020-06-19 ENCOUNTER — Other Ambulatory Visit: Payer: Self-pay

## 2020-06-19 VITALS — BP 114/74 | HR 71 | Temp 98.2°F | Resp 16 | Ht 60.0 in | Wt 158.2 lb

## 2020-06-19 DIAGNOSIS — Z13220 Encounter for screening for lipoid disorders: Secondary | ICD-10-CM | POA: Diagnosis not present

## 2020-06-19 DIAGNOSIS — Z0000 Encounter for general adult medical examination without abnormal findings: Secondary | ICD-10-CM

## 2020-06-19 DIAGNOSIS — F411 Generalized anxiety disorder: Secondary | ICD-10-CM

## 2020-06-19 DIAGNOSIS — M25512 Pain in left shoulder: Secondary | ICD-10-CM | POA: Diagnosis not present

## 2020-06-19 DIAGNOSIS — G4700 Insomnia, unspecified: Secondary | ICD-10-CM

## 2020-06-19 DIAGNOSIS — Z130 Encounter for screening for diseases of the blood and blood-forming organs and certain disorders involving the immune mechanism: Secondary | ICD-10-CM

## 2020-06-19 DIAGNOSIS — Z0001 Encounter for general adult medical examination with abnormal findings: Secondary | ICD-10-CM

## 2020-06-19 DIAGNOSIS — Z13228 Encounter for screening for other metabolic disorders: Secondary | ICD-10-CM

## 2020-06-19 DIAGNOSIS — Z1329 Encounter for screening for other suspected endocrine disorder: Secondary | ICD-10-CM

## 2020-06-19 MED ORDER — SERTRALINE HCL 50 MG PO TABS: 50.0000 mg | ORAL_TABLET | Freq: Every day | ORAL | 1 refills | Status: DC

## 2020-06-19 NOTE — Progress Notes (Signed)
HPI: Madison Robinson is a 44 y.o. female, who is here today for her routine physical.  Last CPE: 10/29/17.  Regular exercise 3 or more time per week: She has not been consistent due to work schedule. Following a healthy diet: "Not as healthy as it should be." She cooks during weekends. She eats fast food. She lives with her fiance.  Chronic medical problems: Anxiety and iron def anemia among some.  Pap smear: 08/2017.Marland Kitchen Last visit with her gyn on 11/20/2017.  There is no immunization history on file for this patient. Tdap through employer, 2013.  Mammogram: 04/06/19 at her gyn's office. Colonoscopy: Never DEXA: N/A  Hep C screening: 12/10/17  Concerns today: She is concerned about worsening anxiety.  She took medication in the past, sertraline. She has a long hx of anxiety. "A little" depression. She does not sleep well, wakes up a few times through the nigh and has difficulty going back to sleep. She has not problem falling asleep. She has not tried OTC med. Depression screen [employer] 2/9 06/19/2020  Decreased Interest 0  Down, Depressed, Hopeless 1  PHQ - 2 Score 1  Altered sleeping 1  Tired, decreased energy 1  Change in appetite 1  Feeling bad or failure about yourself  1  Trouble concentrating 1  Moving slowly or fidgety/restless 0  Suicidal thoughts 0  PHQ-9 Score 6  Difficult doing work/chores Somewhat difficult   GAD 7 : Generalized Anxiety Score 06/19/2020  Nervous, Anxious, on Edge 1  Control/stop worrying 1  Worry too much - different things 1  Trouble relaxing 1  Restless 1  Easily annoyed or irritable 1  Afraid - awful might happen 0  Total GAD 7 Score 6  Anxiety Difficulty Somewhat difficult    4-5 months of left shoulder pain. It is worse at night when lying down on either side, better when lying on her back. No hx of trauma. Certain movements makes it worse. No limitation of ROM.  Review of Systems  Constitutional: Positive for  fatigue. Negative for appetite change and fever.  HENT: Negative for dental problem, hearing loss, mouth sores and sore throat.   Eyes: Negative for redness and visual disturbance.  Respiratory: Negative for cough, shortness of breath and wheezing.   Cardiovascular: Negative for chest pain and leg swelling.  Gastrointestinal: Negative for abdominal pain, nausea and vomiting.       No changes in bowel habits.  Endocrine: Negative for cold intolerance, heat intolerance, polydipsia, polyphagia and polyuria.  Genitourinary: Negative for decreased urine volume, dysuria, hematuria, vaginal bleeding and vaginal discharge.  Musculoskeletal: Positive for arthralgias. Negative for gait problem and myalgias.  Skin: Negative for color change and rash.  Allergic/Immunologic: Negative for environmental allergies.  Neurological: Negative for syncope, weakness and headaches.  Hematological: Negative for adenopathy. Does not bruise/bleed easily.  Psychiatric/Behavioral: Positive for sleep disturbance. Negative for confusion. The patient is nervous/anxious.   All other systems reviewed and are negative.  Current Outpatient Medications on File Prior to Visit  Medication Sig Dispense Refill  . levonorgestrel (MIRENA, 52 MG,) 20 MCG/24HR IUD Mirena 20 mcg/24 hours (5 yrs) 52 mg intrauterine device  Take 1 device by intrauterine route.     No current facility-administered medications on file prior to visit.   Past Medical History:  Diagnosis Date  . Anemia    01/29/2013 BLOOD TRANFUSION  . Chicken pox   . History of blood transfusion    Past Surgical History:  Procedure Laterality Date  .  DILATATION & CURRETTAGE/HYSTEROSCOPY WITH RESECTOCOPE N/A 03/17/2013   Procedure: DILATATION & CURETTAGE/HYSTEROSCOPY WITH RESECTOCOPE;  Surgeon: Delice Lesch, MD;  Location: [facility];  Service: Gynecology;  Laterality: N/A;    Allergies  Allergen Reactions  . Bactrim [Sulfamethoxazole-Trimethoprim] Rash     Assumed from med off antibiotic for 2 day then itchy drug looking rash  . Penicillins    Family History  Problem Relation Age of Onset  . Hypertension Mother   . Diabetes Father   . Hypertension Father   . Hyperlipidemia Father   . Drug abuse Father   . Arthritis Father   . Stroke Father   . Cancer Paternal Grandmother   . Breast cancer Paternal Aunt     Social History   Socioeconomic History  . Marital status: Single    Spouse name: Not on file  . Number of children: 1  . Years of education: Not on file  . Highest education level: Not on file  Occupational History  . Not on file  Tobacco Use  . Smoking status: Never Smoker  . Smokeless tobacco: Never Used  Vaping Use  . Vaping Use: Never used  Substance and Sexual Activity  . Alcohol use: Yes    Comment: wine once a month  . Drug use: No  . Sexual activity: Yes    Partners: Male    Birth control/protection: I.U.D.  Other Topics Concern  . Not on file  Social History Narrative  . Not on file   Social Determinants of Health   Financial Resource Strain:   . Difficulty of Paying Living Expenses: Not on file  Food Insecurity:   . Worried About Charity fundraiser in the Last Year: Not on file  . Ran Out of Food in the Last Year: Not on file  Transportation Needs:   . Lack of Transportation (Medical): Not on file  . Lack of Transportation (Non-Medical): Not on file  Physical Activity:   . Days of Exercise per Week: Not on file  . Minutes of Exercise per Session: Not on file  Stress:   . Feeling of Stress : Not on file  Social Connections:   . Frequency of Communication with Friends and Family: Not on file  . Frequency of Social Gatherings with Friends and Family: Not on file  . Attends Religious Services: Not on file  . Active Member of Clubs or Organizations: Not on file  . Attends Archivist Meetings: Not on file  . Marital Status: Not on file   Vitals:   06/19/20 0824  BP: 114/74  Pulse: 71   Resp: 16  Temp: 98.2 F (36.8 C)  SpO2: 96%   Body mass index is 30.9 kg/m.  Wt Readings from Last 3 Encounters:  06/19/20 158 lb 3.2 oz (71.8 kg)  04/06/18 168 lb 2 oz (76.3 kg)  12/10/17 156 lb 1.6 oz (70.8 kg)   Physical Exam Vitals and nursing note reviewed.  Constitutional:      General: She is not in acute distress.    Appearance: She is well-developed.  HENT:     Head: Normocephalic and atraumatic.     Right Ear: Hearing, tympanic membrane, ear canal and external ear normal.     Left Ear: Hearing, tympanic membrane, ear canal and external ear normal.     Mouth/Throat:     Mouth: Mucous membranes are moist.     Pharynx: Oropharynx is clear. Uvula midline.  Eyes:     Extraocular Movements:  Extraocular movements intact.     Conjunctiva/sclera: Conjunctivae normal.     Pupils: Pupils are equal, round, and reactive to light.  Neck:     Thyroid: No thyromegaly.     Trachea: No tracheal deviation.  Cardiovascular:     Rate and Rhythm: Normal rate and regular rhythm.     Pulses:          Dorsalis pedis pulses are 2+ on the right side and 2+ on the left side.     Heart sounds: No murmur heard.   Pulmonary:     Effort: Pulmonary effort is normal. No respiratory distress.     Breath sounds: Normal breath sounds.  Abdominal:     Palpations: Abdomen is soft. There is no hepatomegaly or mass.     Tenderness: There is no abdominal tenderness.  Genitourinary:    Comments: Deferred to gyn. Musculoskeletal:     Left shoulder: No deformity or bony tenderness. Normal range of motion.     Comments: No major deformity or signs of synovitis appreciated. Left shoulder: No deformity, edema, or erythema appreciated.No muscle atrophy. Luan Pulling' test mild pain elicited, empty can supraspinatus test elicits pain, lift-Off Subscapularis test negative.   Lymphadenopathy:     Cervical: No cervical adenopathy.     Upper Body:     Right upper body: No supraclavicular adenopathy.      Left upper body: No supraclavicular adenopathy.  Skin:    General: Skin is warm.     Findings: No erythema or rash.  Neurological:     General: No focal deficit present.     Mental Status: She is alert and oriented to person, place, and time.     Cranial Nerves: No cranial nerve deficit.     Coordination: Coordination normal.     Gait: Gait normal.     Deep Tendon Reflexes:     Reflex Scores:      Bicep reflexes are 2+ on the right side and 2+ on the left side.      Patellar reflexes are 2+ on the right side and 2+ on the left side.  ASSESSMENT AND PLAN:  Ms. Oksana Deberry was here today annual physical examination.  Orders Placed This Encounter  Procedures  . DG Shoulder Left  . BASIC METABOLIC PANEL WITH GFR  . Lipid panel  . TSH  . Ambulatory referral to Physical Therapy    Lab Results  Component Value Date   CREATININE 0.61 06/19/2020   BUN 15 06/19/2020   NA 139 06/19/2020   K 4.5 06/19/2020   CL 104 06/19/2020   CO2 29 06/19/2020   Lab Results  Component Value Date   CHOL 162 06/19/2020   HDL 55 06/19/2020   LDLCALC 92 06/19/2020   TRIG 66 06/19/2020   CHOLHDL 2.9 06/19/2020   Routine general medical examination at a health care facility She understands the importance of regular physical activity and healthy diet for prevention of chronic illness and/or complications. Preventive guidelines reviewed. Vaccination up to date. Colon cancer screening not decided today. Next CPE in a year.  The 10-year ASCVD risk score Mikey Bussing DC Jr., et al., 2013) is: 0.5%   Values used to calculate the score:     Age: 62 years     Sex: Female     Is Non-Hispanic African American: Yes     Diabetic: No     Tobacco smoker: No     Systolic Blood Pressure: 063 mmHg  Is BP treated: No     HDL Cholesterol: 55 mg/dL     Total Cholesterol: 162 mg/dL  Left shoulder pain, unspecified chronicity ? Rotator cuff tendinitis. PT will be arranged. Further recommendations  according to imaging results.  Generalized anxiety disorder We discussed some treatment options. Sertraline helped in the past and well tolerated, so she agrees with trying it again. Some side effects discussed.  -     sertraline (ZOLOFT) 50 MG tablet; Take 1 tablet (50 mg total) by mouth daily.  Screening for lipoid disorders -     Lipid panel  Screening for endocrine, metabolic and immunity disorder -     BASIC METABOLIC PANEL WITH GFR  Insomnia, unspecified type We discussed possible causes discussed. Good sleep hygiene  If aggravated by anxiety, Sertraline will help. OTC Melatonin may help.  Return in 6 weeks (on 07/31/2020) for anxiety..   Jameyah Fennewald G. Martinique, MD  [facility] [place] office.   Today you have you routine preventive visit.  At least 150 minutes of moderate exercise per week, daily brisk walking for 15-30 min is a good exercise option. Healthy diet low in saturated (animal) fats and sweets and consisting of fresh fruits and vegetables, lean meats such as fish and white chicken and whole grains.  These are some of recommendations for screening depending of age and risk factors:  - Vaccines:  Tdap vaccine every 10 years.  Shingles vaccine recommended at age 21, could be given after 45 years of age but not sure about insurance coverage.   Pneumonia vaccines: Pneumovax at 101. Sometimes Pneumovax is giving earlier if history of smoking, lung disease,diabetes,kidney disease among some.  Screening for diabetes at age 83 and every 3 years.  Cervical cancer prevention:  Pap smear starts at 45 years of age and continues periodically until 45 years old in low risk women. Pap smear every 3 years between 23 and 27 years old. Pap smear every 3-5 years between women 10 and older if pap smear negative and HPV screening negative.   -Breast cancer: Mammogram: There is disagreement between experts about when to start screening in low risk asymptomatic  female but recent recommendations are to start screening at 1 and not later than 45 years old , every 1-2 years and after 45 yo q 2 years. Screening is recommended until 45 years old but some women can continue screening depending of healthy issues.  Colon cancer screening: Has been recently changed to 45 yo. Insurance may not cover until you are 45 years old. Screening is recommended until 45 years old.  Cholesterol disorder screening at age 70 and every 3 years.  Also recommended:  1. Dental visit- Brush and floss your teeth twice daily; visit your dentist twice a year. 2. Eye doctor- Get an eye exam at least every 2 years. 3. Helmet use- Always wear a helmet when riding a bicycle, motorcycle, rollerblading or skateboarding. 4. Safe sex- If you may be exposed to sexually transmitted infections, use a condom. 5. Seat belts- Seat belts can save your live; always wear one. 6. Smoke/Carbon Monoxide detectors- These detectors need to be installed on the appropriate level of your home. Replace batteries at least once a year. 7. Skin cancer- When out in the sun please cover up and use sunscreen 15 SPF or higher. 8. Violence- If anyone is threatening or hurting you, please tell your healthcare provider.  9. Drink alcohol in moderation- Limit alcohol intake to one drink or less per day.  Never drink and drive. 10. Calcium supplementation 1000 to 1200 mg daily, ideally through your diet.  Vitamin D supplementation 800 units daily.  A few things to remember from today's visit:   Left shoulder pain, unspecified chronicity - Plan: Ambulatory referral to Physical Therapy, DG Shoulder Left  Generalized anxiety disorder - Plan: sertraline (ZOLOFT) 50 MG tablet  Routine general medical examination at a health care facility  Screening for lipoid disorders - Plan: Lipid panel  Screening for endocrine, metabolic and immunity disorder - Plan: BASIC METABOLIC PANEL WITH GFR  Insomnia, unspecified type -  Plan: TSH  If you need refills please call your pharmacy. Do not use My Chart to request refills or for acute issues that need immediate attention.   Today we started Sertraline, this type of medications can increase suicidal risk. This is more prevalent among children,adolecents, and young adults with major depression or other psychiatric disorders. It can also make depression worse. Most common side effects are gastrointestinal, self limited after a few weeks: diarrhea, nausea, constipation  Or diarrhea among some.  In general it is well tolerated. We will follow closely.  Please be sure medication list is accurate. If a new problem present, please set up appointment sooner than planned today.

## 2020-06-19 NOTE — Patient Instructions (Addendum)
Today you have you routine preventive visit.  At least 150 minutes of moderate exercise per week, daily brisk walking for 15-30 min is a good exercise option. Healthy diet low in saturated (animal) fats and sweets and consisting of fresh fruits and vegetables, lean meats such as fish and white chicken and whole grains.  These are some of recommendations for screening depending of age and risk factors:  - Vaccines:  Tdap vaccine every 10 years.  Shingles vaccine recommended at age 45, could be given after 45 years of age but not sure about insurance coverage.   Pneumonia vaccines: Pneumovax at 25. Sometimes Pneumovax is giving earlier if history of smoking, lung disease,diabetes,kidney disease among some.  Screening for diabetes at age 43 and every 3 years.  Cervical cancer prevention:  Pap smear starts at 45 years of age and continues periodically until 45 years old in low risk women. Pap smear every 3 years between 56 and 38 years old. Pap smear every 3-5 years between women 59 and older if pap smear negative and HPV screening negative.   -Breast cancer: Mammogram: There is disagreement between experts about when to start screening in low risk asymptomatic female but recent recommendations are to start screening at 44 and not later than 45 years old , every 1-2 years and after 45 yo q 2 years. Screening is recommended until 45 years old but some women can continue screening depending of healthy issues.  Colon cancer screening: Has been recently changed to 45 yo. Insurance may not cover until you are 45 years old. Screening is recommended until 45 years old.  Cholesterol disorder screening at age 45 and every 3 years.  Also recommended:  1. Dental visit- Brush and floss your teeth twice daily; visit your dentist twice a year. 2. Eye doctor- Get an eye exam at least every 2 years. 3. Helmet use- Always wear a helmet when riding a bicycle, motorcycle, rollerblading or  skateboarding. 4. Safe sex- If you may be exposed to sexually transmitted infections, use a condom. 5. Seat belts- Seat belts can save your live; always wear one. 6. Smoke/Carbon Monoxide detectors- These detectors need to be installed on the appropriate level of your home. Replace batteries at least once a year. 7. Skin cancer- When out in the sun please cover up and use sunscreen 15 SPF or higher. 8. Violence- If anyone is threatening or hurting you, please tell your healthcare provider.  9. Drink alcohol in moderation- Limit alcohol intake to one drink or less per day. Never drink and drive. 10. Calcium supplementation 1000 to 1200 mg daily, ideally through your diet.  Vitamin D supplementation 800 units daily.  A few things to remember from today's visit:   Left shoulder pain, unspecified chronicity - Plan: Ambulatory referral to Physical Therapy, DG Shoulder Left  Generalized anxiety disorder - Plan: sertraline (ZOLOFT) 50 MG tablet  Routine general medical examination at a health care facility  Screening for lipoid disorders - Plan: Lipid panel  Screening for endocrine, metabolic and immunity disorder - Plan: BASIC METABOLIC PANEL WITH GFR  Insomnia, unspecified type - Plan: TSH  If you need refills please call your pharmacy. Do not use My Chart to request refills or for acute issues that need immediate attention.   Today we started Sertraline, this type of medications can increase suicidal risk. This is more prevalent among children,adolecents, and young adults with major depression or other psychiatric disorders. It can also make depression worse. Most common side effects  are gastrointestinal, self limited after a few weeks: diarrhea, nausea, constipation  Or diarrhea among some.  In general it is well tolerated. We will follow closely.     Please be sure medication list is accurate. If a new problem present, please set up appointment sooner than planned  today.

## 2020-06-20 LAB — LIPID PANEL
Cholesterol: 162 mg/dL (ref <200)
HDL: 55 mg/dL (ref >=50)
LDL Cholesterol (Calc): 92 mg/dL (calc)
Non-HDL Cholesterol (Calc): 107 mg/dL (calc) (ref <130)
Total CHOL/HDL Ratio: 2.9 (calc) (ref <5.0)
Triglycerides: 66 mg/dL (ref <150)

## 2020-06-20 LAB — BASIC METABOLIC PANEL WITH GFR
BUN: 15 mg/dL (ref 7–25)
CO2: 29 mmol/L (ref 20–32)
Calcium: 9.7 mg/dL (ref 8.6–10.2)
Chloride: 104 mmol/L (ref 98–110)
Creat: 0.61 mg/dL (ref 0.50–1.10)
GFR, Est African American: 127 mL/min/{1.73_m2} (ref >=60)
GFR, Est Non African American: 109 mL/min/{1.73_m2} (ref >=60)
Glucose, Bld: 80 mg/dL (ref 65–99)
Potassium: 4.5 mmol/L (ref 3.5–5.3)
Sodium: 139 mmol/L (ref 135–146)

## 2020-06-20 LAB — TSH: TSH: 0.45 mIU/L

## 2020-07-06 ENCOUNTER — Ambulatory Visit: Payer: 59 | Admitting: Physical Therapy

## 2020-07-22 HISTORY — PX: ABDOMINOPLASTY: SUR9

## 2020-07-31 ENCOUNTER — Encounter: Payer: Self-pay | Admitting: Family Medicine

## 2020-07-31 ENCOUNTER — Encounter: Payer: 59 | Admitting: Family Medicine

## 2020-07-31 NOTE — Progress Notes (Signed)
Sent several links in the morning (at the time of appt). Called around 2:58 pm and left message. Madison Orrego Martinique, MD Sent link. Madison Vasco Martinique, MD

## 2020-08-15 ENCOUNTER — Ambulatory Visit: Payer: 59 | Attending: Family Medicine | Admitting: Physical Therapy

## 2020-08-15 ENCOUNTER — Encounter: Payer: Self-pay | Admitting: Physical Therapy

## 2020-08-15 ENCOUNTER — Other Ambulatory Visit: Payer: Self-pay

## 2020-08-15 DIAGNOSIS — M25512 Pain in left shoulder: Secondary | ICD-10-CM | POA: Diagnosis present

## 2020-08-15 DIAGNOSIS — G8929 Other chronic pain: Secondary | ICD-10-CM | POA: Insufficient documentation

## 2020-08-15 DIAGNOSIS — M6281 Muscle weakness (generalized): Secondary | ICD-10-CM | POA: Diagnosis present

## 2020-08-15 NOTE — Patient Instructions (Signed)
Access Code: [email or web address] URL: [email or web address] Date: 08/15/2020 Prepared by: Hilda Blades  Exercises  . Prone Single Arm Shoulder Y with Dumbbell - 1 x daily - 7 x weekly - 2 sets - 10 reps . Prone Single Arm Shoulder Horizontal Abduction with Dumbbell - Palm Down - 1 x daily - 7 x weekly - 2 sets - 10 reps . Shoulder Abduction with Dumbbells - Palms Down - 1 x daily - 7 x weekly - 2 sets - 10 reps . Shoulder External Rotation with Anchored Resistance - 1 x daily - 7 x weekly - 2 sets - 10 reps

## 2020-08-15 NOTE — Therapy (Addendum)
[facility] [address] [place], [place], [address] Phone: [phone]   Fax:  [phone]  Physical Therapy Evaluation  Patient Details  Name: Madison Robinson MRN: [id number] Date of Birth: May 27, 1976 Referring Provider (PT): [place], Betty G, MD   Encounter Date: 08/15/2020   PT End of Session - 08/15/20 0927     Visit Number 1    Number of Visits 8    Date for PT Re-Evaluation 10/10/20    Authorization Type UHC    Authorization Time Period FOTO by 6th visit    PT Start Time 0833    PT Stop Time 0914    PT Time Calculation (min) 41 min    Activity Tolerance Patient tolerated treatment well    Behavior During Therapy [facility] for tasks assessed/performed             Past Medical History:  Diagnosis Date   Anemia    01/29/2013 BLOOD TRANFUSION   Chicken pox    History of blood transfusion     Past Surgical History:  Procedure Laterality Date   DILATATION & CURRETTAGE/HYSTEROSCOPY WITH RESECTOCOPE N/A 03/17/2013   Procedure: DILATATION & CURETTAGE/HYSTEROSCOPY WITH RESECTOCOPE;  Surgeon: Purcell Nails, MD;  Location: [facility];  Service: Gynecology;  Laterality: N/A;    There were no vitals filed for this visit.    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 She has 8/10 pain at worse at night and 0/10 pain currently. Pt thinks that she may have done it while working out but can't trace it to a certain incident.    Pertinent History Pt reports having procedure done recently where she is on a 5-7 pound lifting restriction per doctor order.    Limitations Lifting;Other (comment)   sleeping   Diagnostic tests x-ray L shoulder neg    Patient Stated Goals get back to bootcamp working out    Currently in Pain? No/denies    Pain Score 0-No pain    Pain Location Shoulder    Pain Orientation Left     Pain Descriptors / Indicators Other (Comment);Aching   pulling   Pain Type Chronic pain    Pain Onset More than a month ago    Pain Frequency Intermittent    Aggravating Factors  sleeping on R side and back    Pain Relieving Factors icy hot, changing positions    Effect of Pain on Daily Activities limited with sleep and working out    Multiple Pain Sites No                  [facility] PT Assessment - 08/15/20 0001       Assessment   Medical Diagnosis L shoulder pain    Referring Provider (PT) [place], Betty G, MD    Onset Date/Surgical Date --   4-5 months   Hand Dominance Right    Prior Therapy none      Precautions   Precautions Other (comment)    Precaution Comments lifting precautions 5-7 pounds      Restrictions   Weight Bearing Restrictions No      Balance Screen   Has the patient fallen in the past 6 months No    Has the patient had a decrease in activity level because of a fear of falling?  No    Is the patient  reluctant to leave their home because of a fear of falling?  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)    Leisure working out (burn [employer])      Cognition   Overall Cognitive Status Within Functional Limits for tasks assessed      Observation/Other Assessments   Focus on Therapeutic Outcomes (FOTO)  86      ROM / Strength   AROM / PROM / Strength AROM;Strength      AROM   Overall AROM  Within functional limits for tasks performed    Overall AROM Comments cervical and shoulder ROM full and no pain reported    AROM Assessment Site Cervical;Shoulder    Right/Left Shoulder Left;Right      Strength   Overall Strength Comments L middle trap: 4-/5, L lower trap 4-/5    Strength Assessment Site Shoulder;Cervical    Right/Left Shoulder Left;Right     Right Shoulder ABduction 5/5    Right Shoulder Internal Rotation 5/5    Right Shoulder External Rotation 5/5    Left Shoulder ABduction 4/5    Left Shoulder Internal Rotation 5/5    Left Shoulder External Rotation 4/5    Cervical Flexion 5/5    Cervical Extension 5/5    Cervical - Right Side Bend 5/5    Cervical - Left Side Bend 5/5      Palpation   Palpation comment nontender to palpation                                Objective measurements completed on examination: See above findings.          [facility] Adult PT Treatment/Exercise - 08/15/20 0001       Exercises   Exercises Shoulder      Shoulder Exercises: Seated   Abduction Strengthening;Left;10 reps;Other (comment)   2 sets   ABduction Weight (lbs) 2      Shoulder Exercises: Prone   Other Prone Exercises Prone Y and T 2#: 2x10      Shoulder Exercises: Standing   External Rotation Left    Theraband Level (Shoulder External Rotation) Level 3 (Green)    External Rotation Limitations cueing needed to keep shoulder in adduction                          PT Education - 08/15/20 0926     Education Details HEP, exam findings, etiology of sx, POC    Person(s) Educated Patient    Methods Explanation;Demonstration;Tactile cues;Verbal cues;Handout    Comprehension Verbalized understanding;Need further instruction              PT Short Term Goals - 08/15/20 1009       PT SHORT TERM GOAL #1   Title PT with review and educate patient on FOTO    Time 3    Period Weeks    Status New    Target Date 09/05/20      PT SHORT TERM GOAL #2   Title Pt will progress to a 4+/5 with L ER MMT in order to decrease pain during sleeping and increase participation in workouts.    Time 4  Period Weeks    Status New    Target Date 09/12/20      PT SHORT TERM GOAL #3   Title Pt will progress to a 4/5 with L middle trapezius MMT in order to decrease pain during sleeping and increase participation in  workouts.    Time 4    Period Weeks    Status New    Target Date 09/12/20      PT SHORT TERM GOAL #4   Title Pt will progress to a 4/5 with L lower trapezius MMT in order to decrease pain during sleeping and increase participation in workouts.    Time 4    Period Weeks    Status New    Target Date 09/12/20                PT Long Term Goals - 08/15/20 1255       PT LONG TERM GOAL #1   Title Pt will be at 90% on FOTO in order to show decreased functional limitations with sleeping and working out.    Time 8    Period Weeks    Status New    Target Date 10/10/20      PT LONG TERM GOAL #2   Title Pt will report being able to sleep at night uninteruppted without waking up from pain.    Time 8    Period Weeks    Status New    Target Date 10/10/20      PT LONG TERM GOAL #3   Title Pt will progress to a 5/5 with L ER MMT in order to decrease pain during sleeping and increase participation in workouts.    Time 8    Period Weeks    Status New    Target Date 10/10/20      PT LONG TERM GOAL #4   Title Pt will progress to a 4+/5 with L middle trapezius MMT in order to decrease pain during sleeping and increase participation in workouts    Time 8    Period Weeks    Status New    Target Date 10/10/20      PT LONG TERM GOAL #5   Title Pt will progress to a 4+/5 with L lower trapezius MMT in order to decrease pain during sleeping and increase participation in workouts    Time 8    Period Weeks    Status New    Target Date 10/10/20                         Plan - 08/15/20 0932     Clinical Impression Statement Pt is a 45 y/o F presenting to PT with c/o L chronic shoulder pain for 4-5 months with no MOI but with gradual onset with working out. Examination reveals strength deficits with L middle/lower trapezius, middle deltoid, and rotator cuff ER's, showing sx consistent with rotator cuff tendinopathy. All of this is limiting her from sleeping at night and working  out. Pt would benefit from skilled therapy in order to work on L shoulder strength and endurance in order to sleep without pain and return to PLOF.    Personal Factors and Comorbidities Time since onset of injury/illness/exacerbation;Comorbidity 1    Comorbidities recent procedure, pt on lifting restriction    Examination-Activity Limitations Sleep    Examination-Participation Restrictions Other   workouts   Stability/Clinical Decision Making Stable/Uncomplicated    Clinical Decision Making Low  Rehab Potential Good    PT Frequency 1x / week    PT Duration 8 weeks    PT Treatment/Interventions Cryotherapy;Electrical Stimulation;ADLs/Self Care Home Management;Functional mobility training;Therapeutic activities;Therapeutic exercise;Patient/family education;Manual techniques    PT Next Visit Plan progressive strengthening/endurance training, review HEP    PT Home Exercise Plan [id number]    [id number] and Agree with Plan of Care Patient             Patient will benefit from skilled therapeutic intervention in order to improve the following deficits and impairments:  Decreased endurance,Decreased activity tolerance,Decreased strength,Pain  Visit Diagnosis: Chronic left shoulder pain  Muscle weakness (generalized)      Problem List Patient Active Problem List   Diagnosis Date Noted   Menorrhagia 01/29/2013   Anemia 01/29/2013    Caleb Popp, SPT 08/15/2020, 6:00 PM  [facility] [address] [place], [place], [address] Phone: [phone]   Fax:  [phone]  Name: Madison Robinson MRN: [id number] Date of Birth: 19-Mar-1976

## 2020-08-15 NOTE — Addendum Note (Signed)
Addended by: Bobette Mo on: 08/15/2020 06:54 PM   Modules accepted: Orders

## 2020-08-23 ENCOUNTER — Encounter: Payer: Self-pay | Admitting: Physical Therapy

## 2020-08-23 ENCOUNTER — Ambulatory Visit: Payer: 59 | Attending: Family Medicine | Admitting: Physical Therapy

## 2020-08-23 ENCOUNTER — Other Ambulatory Visit: Payer: Self-pay

## 2020-08-23 DIAGNOSIS — M25512 Pain in left shoulder: Secondary | ICD-10-CM | POA: Diagnosis not present

## 2020-08-23 DIAGNOSIS — M6281 Muscle weakness (generalized): Secondary | ICD-10-CM | POA: Diagnosis present

## 2020-08-23 DIAGNOSIS — G8929 Other chronic pain: Secondary | ICD-10-CM | POA: Diagnosis present

## 2020-08-25 ENCOUNTER — Encounter: Payer: Self-pay | Admitting: Physical Therapy

## 2020-08-25 NOTE — Therapy (Signed)
[place], [place], [address] Phone: [phone]   Fax:  [phone]  Physical Therapy Treatment  Patient Details  Name: Lan Entsminger MRN: [id number] Date of Birth: 08-Nov-1975 Referring Provider (PT): [place], Betty G, MD   Encounter Date: 08/23/2020   PT End of Session - 08/25/20 1219    Visit Number 2    Number of Visits 8    Authorization Time Period FOTO by 6th visit    PT Start Time 1640   Patient 10 minutes late   PT Stop Time 1720    PT Time Calculation (min) 40 min    Activity Tolerance Patient tolerated treatment well    Behavior During Therapy [facility] for tasks assessed/performed           Past Medical History:  Diagnosis Date  . Anemia    01/29/2013 BLOOD TRANFUSION  . Chicken pox   . History of blood transfusion     Past Surgical History:  Procedure Laterality Date  . DILATATION & CURRETTAGE/HYSTEROSCOPY WITH RESECTOCOPE N/A 03/17/2013   Procedure: DILATATION & CURETTAGE/HYSTEROSCOPY WITH RESECTOCOPE;  Surgeon: Delice Lesch, MD;  Location: [facility];  Service: Gynecology;  Laterality: N/A;    There were no vitals filed for this visit.   Subjective Assessment - 08/25/20 1218    Subjective Patient has been working on her exercises. she reports it is about the same. It is not hurtin tonight but hurts at night when she lies on her left side.    Pertinent History Pt reports having procedure done recently where she is on a 5-7 pound lifting restriction per doctor order.    Currently in Pain? No/denies                             [facility] Adult PT Treatment/Exercise - 08/25/20 0001      Shoulder Exercises: Standing   External Rotation Left    Theraband Level (Shoulder External Rotation) Level 3 (Green)    Internal Rotation Left;20 reps    Theraband Level (Shoulder Internal Rotation) Level 3 (Green)    Extension 20 reps;Theraband    Theraband Level (Shoulder  Extension) Level 3 (Green)    Row 20 reps;Theraband    Theraband Level (Shoulder Row) Level 3 (Green)      Manual Therapy   Manual therapy comments inferior and posterior glides 2x20 sec hold                  PT Education - 08/25/20 1219    Education Details updated HEP    Person(s) Educated Patient    Methods Explanation;Demonstration;Tactile cues;Verbal cues    Comprehension Verbalized understanding;Returned demonstration;Verbal cues required;Tactile cues required            PT Short Term Goals - 08/15/20 1009      PT SHORT TERM GOAL #1   Title PT with review and educate patient on FOTO    Time 3    Period Weeks    Status New    Target Date 09/05/20      PT SHORT TERM GOAL #2   Title Pt will progress to a 4+/5 with L ER MMT in order to decrease pain during sleeping and increase participation in workouts.    Time 4    Period Weeks    Status New    Target Date 09/12/20      PT SHORT TERM GOAL #3  Title Pt will progress to a 4/5 with L middle trapezius MMT in order to decrease pain during sleeping and increase participation in workouts.    Time 4    Period Weeks    Status New    Target Date 09/12/20      PT SHORT TERM GOAL #4   Title Pt will progress to a 4/5 with L lower trapezius MMT in order to decrease pain during sleeping and increase participation in workouts.    Time 4    Period Weeks    Status New    Target Date 09/12/20             PT Long Term Goals - 08/15/20 1255      PT LONG TERM GOAL #1   Title Pt will be at 90% on FOTO in order to show decreased functional limitations with sleeping and working out.    Time 8    Period Weeks    Status New    Target Date 10/10/20      PT LONG TERM GOAL #2   Title Pt will report being able to sleep at night uninteruppted without waking up from pain.    Time 8    Period Weeks    Status New    Target Date 10/10/20      PT LONG TERM GOAL #3   Title Pt will progress to a 5/5 with L ER MMT in order  to decrease pain during sleeping and increase participation in workouts.    Time 8    Period Weeks    Status New    Target Date 10/10/20      PT LONG TERM GOAL #4   Title Pt will progress to a 4+/5 with L middle trapezius MMT in order to decrease pain during sleeping and increase participation in workouts    Time 8    Period Weeks    Status New    Target Date 10/10/20      PT LONG TERM GOAL #5   Title Pt will progress to a 4+/5 with L lower trapezius MMT in order to decrease pain during sleeping and increase participation in workouts    Time 8    Period Weeks    Status New    Target Date 10/10/20                 Plan - 08/25/20 1220    Clinical Impression Statement Therapy added exercises to her standing band series and added exercises to her table/bed exercises. she tolerated well. therapy perfromed amnaul therapy to reduce potential impingement. She had full pain free ROM after manual therapy. Prior she had minor pain with end range fleixon. Therapy will continue to advance strneghtneing as tolerated.    Personal Factors and Comorbidities Time since onset of injury/illness/exacerbation;Comorbidity 1    Examination-Participation Restrictions Other    Stability/Clinical Decision Making Stable/Uncomplicated    Clinical Decision Making Low    Rehab Potential Good    PT Frequency 1x / week    PT Duration 8 weeks    PT Treatment/Interventions Cryotherapy;Electrical Stimulation;ADLs/Self Care Home Management;Functional mobility training;Therapeutic activities;Therapeutic exercise;Patient/family education;Manual techniques    PT Next Visit Plan progressive strengthening/endurance training, review HEP    PT Home Exercise Plan [email or web address]    Consulted and Agree with Plan of Care Patient           Patient will benefit from skilled therapeutic intervention in order to improve the following deficits and impairments:  Decreased endurance,Decreased activity tolerance,Decreased  strength,Pain  Visit Diagnosis: Chronic left shoulder pain  Muscle weakness (generalized)     Problem List Patient Active Problem List   Diagnosis Date Noted  . Menorrhagia 01/29/2013  . Anemia 01/29/2013    Carney Living PT DPT  08/25/2020, 12:25 PM  [facility] [address] [place], [place], [address] Phone: [phone]   Fax:  [phone]  Name: Anyiah Ollis MRN: [id number] Date of Birth: [birth year]

## 2020-08-30 ENCOUNTER — Ambulatory Visit: Payer: 59 | Admitting: Physical Therapy

## 2020-08-30 ENCOUNTER — Other Ambulatory Visit: Payer: Self-pay

## 2020-08-30 ENCOUNTER — Encounter: Payer: Self-pay | Admitting: Physical Therapy

## 2020-08-30 DIAGNOSIS — M25512 Pain in left shoulder: Secondary | ICD-10-CM | POA: Diagnosis not present

## 2020-08-30 DIAGNOSIS — M6281 Muscle weakness (generalized): Secondary | ICD-10-CM

## 2020-08-30 DIAGNOSIS — G8929 Other chronic pain: Secondary | ICD-10-CM

## 2020-08-30 NOTE — Therapy (Signed)
[place], [place], [address] Phone: [phone]   Fax:  [phone]  Physical Therapy Treatment  Patient Details  Name: Madison Robinson MRN: [id number] Date of Birth: [birth year] Referring Provider (PT): [place], Betty G, MD   Encounter Date: 08/30/2020   PT End of Session - 08/30/20 0827    Visit Number 3    Number of Visits 8    Date for PT Re-Evaluation 10/10/20    Authorization Type UHC    Authorization Time Period FOTO by 6th visit    PT Start Time 0818   pat late   PT Stop Time 0845    PT Time Calculation (min) 27 min           Past Medical History:  Diagnosis Date  . Anemia    01/29/2013 BLOOD TRANFUSION  . Chicken pox   . History of blood transfusion     Past Surgical History:  Procedure Laterality Date  . DILATATION & CURRETTAGE/HYSTEROSCOPY WITH RESECTOCOPE N/A 03/17/2013   Procedure: DILATATION & CURETTAGE/HYSTEROSCOPY WITH RESECTOCOPE;  Surgeon: Delice Lesch, MD;  Location: [facility];  Service: Gynecology;  Laterality: N/A;    There were no vitals filed for this visit.   Subjective Assessment - 08/30/20 0822    Subjective Pain at night (aching and pulling ) has gotten better. No pain with activity. Worst pain is 5/10 lately.    Currently in Pain? No/denies    Aggravating Factors  sleeping on right side, back    Pain Relieving Factors changing positions                             [facility] Adult PT Treatment/Exercise - 08/30/20 0001      Shoulder Exercises: Prone   Other Prone Exercises Prone Y and T 2#: 2x10      Shoulder Exercises: Standing   External Rotation Left    Theraband Level (Shoulder External Rotation) Level 4 (Blue)    Internal Rotation Left;20 reps    Theraband Level (Shoulder Internal Rotation) Level 4 (Blue)    Row 20 reps    Theraband Level (Shoulder Row) Level 4 (Blue)      Shoulder Exercises: ROM/Strengthening   UBE (Upper Arm  Bike) 2 minutes each way, Level 2                  PT Education - 08/30/20 0902    Education Details FOTO score and predictions    Person(s) Educated Patient    Methods Explanation;Handout    Comprehension Verbalized understanding            PT Short Term Goals - 08/30/20 1105      PT SHORT TERM GOAL #1   Title PT with review and educate patient on FOTO    Time 3    Period Weeks    Status Achieved      PT SHORT TERM GOAL #2   Title Pt will progress to a 4+/5 with L ER MMT in order to decrease pain during sleeping and increase participation in workouts.    Time 4    Period Weeks    Status On-going      PT SHORT TERM GOAL #3   Title Pt will progress to a 4/5 with L middle trapezius MMT in order to decrease pain during sleeping and increase participation in workouts.    Time 4    Period Weeks  Status On-going      PT SHORT TERM GOAL #4   Title Pt will progress to a 4/5 with L lower trapezius MMT in order to decrease pain during sleeping and increase participation in workouts.    Time 4    Period Weeks    Status On-going             PT Long Term Goals - 08/15/20 1255      PT LONG TERM GOAL #1   Title Pt will be at 90% on FOTO in order to show decreased functional limitations with sleeping and working out.    Time 8    Period Weeks    Status New    Target Date 10/10/20      PT LONG TERM GOAL #2   Title Pt will report being able to sleep at night uninteruppted without waking up from pain.    Time 8    Period Weeks    Status New    Target Date 10/10/20      PT LONG TERM GOAL #3   Title Pt will progress to a 5/5 with L ER MMT in order to decrease pain during sleeping and increase participation in workouts.    Time 8    Period Weeks    Status New    Target Date 10/10/20      PT LONG TERM GOAL #4   Title Pt will progress to a 4+/5 with L middle trapezius MMT in order to decrease pain during sleeping and increase participation in workouts    Time 8     Period Weeks    Status New    Target Date 10/10/20      PT LONG TERM GOAL #5   Title Pt will progress to a 4+/5 with L lower trapezius MMT in order to decrease pain during sleeping and increase participation in workouts    Time 8    Period Weeks    Status New    Target Date 10/10/20                 Plan - 08/30/20 0902    Clinical Impression Statement Pt arrived late for appointment, due to mix up with appt time. She reports compliance with HEP however is performing 3 sets of 5 reps with heavier weight and stronger cables than initially recommended.  She fatigues with 3# 15 reps x2 and requires cues to hold and perform slow eccentric contol with initial HEP. Recommended lower weight and increased reps and pt verbalized understanding. She reports overall 30% improvement in initial pain with sleep positions. Reviewed FOTO score which she scored higher than normal function at intake. STG#1 met.    PT Next Visit Plan progressive strengthening/endurance training, review HEP    PT Home Exercise Plan [email or web address]    [id number] and Agree with Plan of Care Patient           Patient will benefit from skilled therapeutic intervention in order to improve the following deficits and impairments:  Decreased endurance,Decreased activity tolerance,Decreased strength,Pain  Visit Diagnosis: Chronic left shoulder pain  Muscle weakness (generalized)     Problem List Patient Active Problem List   Diagnosis Date Noted  . Menorrhagia 01/29/2013  . Anemia 01/29/2013    Dorene Ar, PTA 08/30/2020, 11:06 AM  [facility] [address] [place], [place], [address] Phone: [phone]   Fax:  [phone]  Name: Madison Robinson MRN: [id number] Date of Birth: January 30, 1976

## 2020-09-04 ENCOUNTER — Encounter: Payer: Self-pay | Admitting: Physical Therapy

## 2020-09-04 ENCOUNTER — Other Ambulatory Visit: Payer: Self-pay

## 2020-09-04 ENCOUNTER — Ambulatory Visit: Payer: 59 | Admitting: Physical Therapy

## 2020-09-04 DIAGNOSIS — M25512 Pain in left shoulder: Secondary | ICD-10-CM | POA: Diagnosis not present

## 2020-09-04 DIAGNOSIS — G8929 Other chronic pain: Secondary | ICD-10-CM

## 2020-09-04 DIAGNOSIS — M6281 Muscle weakness (generalized): Secondary | ICD-10-CM

## 2020-09-04 NOTE — Patient Instructions (Signed)
Access Code: [email or web address] URL: [email or web address]: 09/04/2020 Prepared by: Hilda Blades  Exercises Shoulder Abduction with Dumbbells - Palms Down - 1 x daily - 7 x weekly - 2 sets - 10 reps Shoulder External Rotation with Anchored Resistance - 1 x daily - 7 x weekly - 2 sets - 10 reps Supine PNF D2 Flexion with Resistance - 1 x daily - 7 x weekly - 2 sets - 10 reps Supine Shoulder Horizontal Abduction with Resistance - 1 x daily - 7 x weekly - 2 sets - 10 reps Horizontal Wall Walk with Resistance - 1 x daily - 7 x weekly - 2 sets - 8 reps

## 2020-09-04 NOTE — Therapy (Signed)
[place], [place], [address] Phone: [phone]   Fax:  [phone]  Physical Therapy Treatment  Patient Details  Name: Madison Robinson MRN: [id number] Date of Birth: 1976-03-13 Referring Provider (PT): Martinique, Betty G, MD   Encounter Date: 09/04/2020   PT End of Session - 09/04/20 0830    Visit Number 4    Number of Visits 8    Date for PT Re-Evaluation 10/10/20    Authorization Type UHC    Authorization Time Period FOTO by 6th visit    PT Start Time 0830    PT Stop Time 0913    PT Time Calculation (min) 43 min    Activity Tolerance Patient tolerated treatment well    Behavior During Therapy [facility] for tasks assessed/performed           Past Medical History:  Diagnosis Date  . Anemia    01/29/2013 BLOOD TRANFUSION  . Chicken pox   . History of blood transfusion     Past Surgical History:  Procedure Laterality Date  . DILATATION & CURRETTAGE/HYSTEROSCOPY WITH RESECTOCOPE N/A 03/17/2013   Procedure: DILATATION & CURETTAGE/HYSTEROSCOPY WITH RESECTOCOPE;  Surgeon: Delice Lesch, MD;  Location: [facility];  Service: Gynecology;  Laterality: N/A;    There were no vitals filed for this visit.   Subjective Assessment - 09/04/20 0832    Subjective The shoulder has gotten better, barely any pain at night and it isn't waking her up. She hasn't had to use icy hot at night for the past couple of days. THe worst pain she has at night is a 3/10 now. I'm not going back to working out until I get clearance from my doctor who did my procedure mid march.    Pertinent History 20# lifting restriction for today now    Currently in Pain? No/denies                             [facility] Adult PT Treatment/Exercise - 09/04/20 0001      Shoulder Exercises: Supine   Horizontal ABduction 10 reps;Other (comment)   2 sets   Theraband Level (Shoulder Horizontal ABduction) Level 2 (Red)    Diagonals  Strengthening;Both;10 reps;Other (comment);Theraband   2 sets   Theraband Level (Shoulder Diagonals) Level 2 (Red)      Shoulder Exercises: Standing   External Rotation Left;20 reps    Theraband Level (Shoulder External Rotation) Level 4 (Blue)    Internal Rotation Left;20 reps    Theraband Level (Shoulder Internal Rotation) Level 4 (Blue)    Extension 20 reps;Theraband;Other (comment)   2 sets   Theraband Level (Shoulder Extension) Level 3 (Green)    Row 20 reps;Other (comment)   2 sets   Theraband Level (Shoulder Row) Level 3 (Green)    Other Standing Exercises 2x10 scaption 3#    Other Standing Exercises wall walking 2 down and backs with red band; wall balls clockwise and counterclockwise 25 each way      Shoulder Exercises: ROM/Strengthening   UBE (Upper Arm Bike) 2 minutes each way, Level 2                  PT Education - 09/04/20 0828    Education Details HEP update    Person(s) Educated Patient    Methods Explanation;Handout    Comprehension Verbalized understanding;Need further instruction            PT  Short Term Goals - 08/30/20 1105      PT SHORT TERM GOAL #1   Title PT with review and educate patient on FOTO    Time 3    Period Weeks    Status Achieved      PT SHORT TERM GOAL #2   Title Pt will progress to a 4+/5 with L ER MMT in order to decrease pain during sleeping and increase participation in workouts.    Time 4    Period Weeks    Status On-going      PT SHORT TERM GOAL #3   Title Pt will progress to a 4/5 with L middle trapezius MMT in order to decrease pain during sleeping and increase participation in workouts.    Time 4    Period Weeks    Status On-going      PT SHORT TERM GOAL #4   Title Pt will progress to a 4/5 with L lower trapezius MMT in order to decrease pain during sleeping and increase participation in workouts.    Time 4    Period Weeks    Status On-going             PT Long Term Goals - 08/15/20 1255      PT LONG  TERM GOAL #1   Title Pt will be at 90% on FOTO in order to show decreased functional limitations with sleeping and working out.    Time 8    Period Weeks    Status New    Target Date 10/10/20      PT LONG TERM GOAL #2   Title Pt will report being able to sleep at night uninteruppted without waking up from pain.    Time 8    Period Weeks    Status New    Target Date 10/10/20      PT LONG TERM GOAL #3   Title Pt will progress to a 5/5 with L ER MMT in order to decrease pain during sleeping and increase participation in workouts.    Time 8    Period Weeks    Status New    Target Date 10/10/20      PT LONG TERM GOAL #4   Title Pt will progress to a 4+/5 with L middle trapezius MMT in order to decrease pain during sleeping and increase participation in workouts    Time 8    Period Weeks    Status New    Target Date 10/10/20      PT LONG TERM GOAL #5   Title Pt will progress to a 4+/5 with L lower trapezius MMT in order to decrease pain during sleeping and increase participation in workouts    Time 8    Period Weeks    Status New    Target Date 10/10/20                 Plan - 09/04/20 0919    Clinical Impression Statement Pt tolerated tx well with no adverse effects. Pt shows improvement in sx at night and improvement in strength. Session focused on increased postural musculature endurance as well as introducing in L CKC stabilization exercises. Pt was quick to fatigue with L CKC stabilization as well as external rotation exercises but with no increase in pain. Pt will continue to benefit from skilled PT in order to increase endurance of postural endurance, L UE strength and stabilization, and decrease pain so that she can return to working out  and promote restful sleep.    PT Treatment/Interventions Cryotherapy;Electrical Stimulation;ADLs/Self Care Home Management;Functional mobility training;Therapeutic activities;Therapeutic exercise;Patient/family education;Manual  techniques    PT Next Visit Plan progress strength/endurance exercises, continue to work on [place] and Agree with Plan of Care Patient           Patient will benefit from skilled therapeutic intervention in order to improve the following deficits and impairments:  Decreased endurance,Decreased activity tolerance,Decreased strength,Pain  Visit Diagnosis: Chronic left shoulder pain  Muscle weakness (generalized)     Problem List Patient Active Problem List   Diagnosis Date Noted  . Menorrhagia 01/29/2013  . Anemia 01/29/2013    Norbert Malkin 09/04/2020, 10:35 AM  [facility] [address] [place], [place], [address] Phone: [phone]   Fax:  [phone]  Name: Lillyahna Hemberger MRN: [id number] Date of Birth: 11-11-1975

## 2020-09-11 ENCOUNTER — Telehealth: Payer: Self-pay | Admitting: Physical Therapy

## 2020-09-11 ENCOUNTER — Ambulatory Visit: Payer: 59 | Admitting: Physical Therapy

## 2020-09-11 NOTE — Telephone Encounter (Signed)
Spoke with patient regarding missed PT appointment. Patient stated she forgot due to being out of town. She was reminded of attendance policy and next scheduled appointment. Patient expressed understanding.   Hilda Blades, PT, DPT, LAT, ATC 09/11/20  9:00 AM Phone: [phone] Fax: [phone]

## 2020-09-18 ENCOUNTER — Other Ambulatory Visit: Payer: Self-pay

## 2020-09-18 ENCOUNTER — Ambulatory Visit: Payer: 59 | Admitting: Physical Therapy

## 2020-09-18 ENCOUNTER — Encounter: Payer: Self-pay | Admitting: Physical Therapy

## 2020-09-18 DIAGNOSIS — M25512 Pain in left shoulder: Secondary | ICD-10-CM

## 2020-09-18 DIAGNOSIS — M6281 Muscle weakness (generalized): Secondary | ICD-10-CM

## 2020-09-18 DIAGNOSIS — G8929 Other chronic pain: Secondary | ICD-10-CM

## 2020-09-18 NOTE — Therapy (Addendum)
[place], [place], [address] Phone: [phone]   Fax:  [phone]  Physical Therapy Treatment / Discharge  Patient Details  Name: Madison Robinson MRN: [id number] Date of Birth: July 10, 1976 Referring Provider (PT): Martinique, Betty G, MD   Encounter Date: 09/18/2020   PT End of Session - 09/18/20 0841    Visit Number 5    Number of Visits 8    Date for PT Re-Evaluation 10/10/20    Authorization Type UHC    Authorization Time Period FOTO by 6th visit    PT Start Time 0836   pt arrived late   PT Stop Time 0916    PT Time Calculation (min) 40 min    Activity Tolerance Patient tolerated treatment well    Behavior During Therapy [facility] for tasks assessed/performed           Past Medical History:  Diagnosis Date  . Anemia    01/29/2013 BLOOD TRANFUSION  . Chicken pox   . History of blood transfusion     Past Surgical History:  Procedure Laterality Date  . DILATATION & CURRETTAGE/HYSTEROSCOPY WITH RESECTOCOPE N/A 03/17/2013   Procedure: DILATATION & CURETTAGE/HYSTEROSCOPY WITH RESECTOCOPE;  Surgeon: Delice Lesch, MD;  Location: [facility];  Service: Gynecology;  Laterality: N/A;    There were no vitals filed for this visit.   Subjective Assessment - 09/18/20 0840    Subjective The shoulder is feeling a lot better, I've been doing a lot of the exercises you guys gave me and they've been helping. I have been able to restfully sleep throughout the night without waking up with shoulder pain for the past week. I get back to working out (lifting restrictions lifted) in 2 weeks.    Currently in Pain? No/denies              [facility] PT Assessment - 09/18/20 0001      Observation/Other Assessments   Focus on Therapeutic Outcomes (FOTO)  91                         [facility] Adult PT Treatment/Exercise - 09/18/20 0001      Shoulder Exercises: Supine   Horizontal ABduction 10 reps;Other  (comment)   2 sets   Theraband Level (Shoulder Horizontal ABduction) Level 2 (Red)    Diagonals Strengthening;Both;10 reps;Other (comment);Theraband   2 sets   Theraband Level (Shoulder Diagonals) Level 2 (Red)      Shoulder Exercises: Standing   External Rotation Left;20 reps    Theraband Level (Shoulder External Rotation) Level 4 (Blue)    Internal Rotation Left;20 reps    Theraband Level (Shoulder Internal Rotation) Level 4 (Blue)    Flexion Limitations 2x15 scaption with dumbells 3#    Extension 20 reps;Theraband;Other (comment)    Theraband Level (Shoulder Extension) Level 4 (Blue)    Row 20 reps;Other (comment)    Theraband Level (Shoulder Row) Level 4 (Blue)    Other Standing Exercises push up plus at counter 2x10    Other Standing Exercises wall walking 2 down and backs with red band on forearms then wall walking 2 down and back on hands elbows extended; wall balls with yellow weighted ball; clockwise and counterclockwise 25 each way      Shoulder Exercises: ROM/Strengthening   UBE (Upper Arm Bike) 2 minutes each way, Level 2  PT Education - 09/18/20 0920    Education Details HEP update, instructed to call back if shoulder pain comes back/exercises aren't helping    Person(s) Educated Patient    Methods Explanation;Handout;Verbal cues;Demonstration;Tactile cues    Comprehension Verbalized understanding;Need further instruction            PT Short Term Goals - 09/18/20 0845      PT SHORT TERM GOAL #1   Title PT with review and educate patient on FOTO    Time 3    Period Weeks    Status Achieved      PT SHORT TERM GOAL #2   Title Pt will progress to a 4+/5 with L ER MMT in order to decrease pain during sleeping and increase participation in workouts.    Status Achieved      PT SHORT TERM GOAL #3   Title Pt will progress to a 4/5 with L middle trapezius MMT in order to decrease pain during sleeping and increase participation in workouts.     Status Achieved      PT SHORT TERM GOAL #4   Title Pt will progress to a 4/5 with L lower trapezius MMT in order to decrease pain during sleeping and increase participation in workouts.    Status Achieved             PT Long Term Goals - 09/18/20 5697      PT LONG TERM GOAL #1   Title Pt will be at 90% on FOTO in order to show decreased functional limitations with sleeping and working out.    Baseline now at 91%    Status Achieved      PT LONG TERM GOAL #2   Title Pt will report being able to sleep at night uninteruppted without waking up from pain.    Status Achieved      PT LONG TERM GOAL #3   Title Pt will progress to a 5/5 with L ER MMT in order to decrease pain during sleeping and increase participation in workouts.    Status Achieved      PT LONG TERM GOAL #4   Title Pt will progress to a 4+/5 with L middle trapezius MMT in order to decrease pain during sleeping and increase participation in workouts    Time 8    Period Weeks    Status Partially Met    Target Date 10/10/20      PT LONG TERM GOAL #5   Title Pt will progress to a 4+/5 with L lower trapezius MMT in order to decrease pain during sleeping and increase participation in workouts    Time 8    Period Weeks    Status Partially Met    Target Date 10/10/20                 Plan - 09/18/20 9480    Clinical Impression Statement Pt tolerated tx well with no adverse effects. Pt is now reporting no pain at night with her shoulder pain that is waking her up for the past week. Because of this, pt is stating that she wants to try stopping PT and continue with HEP by herself at home. Pt shows improvement in endurance with CKC exercises this session, but still showed difficulty with them; as with OKC exercises, pt still shows deficits with exercises focused towards middle and lower trapezius exercises. Pt was able to meet all short term goals. This session long term goals were assessed and they  were all met, except  for the ones focused on MMT strength of lower and middle trapezius. Pt instructed to call back if her shoulder pain comes back and that she can come back within a month before she needs a re-evaluation.    Personal Factors and Comorbidities Time since onset of injury/illness/exacerbation;Comorbidity 1    Comorbidities recent procedure, pt on lifting restriction    Examination-Activity Limitations Sleep    Examination-Participation Restrictions Other    Stability/Clinical Decision Making Stable/Uncomplicated    Clinical Decision Making Low    Rehab Potential Good    PT Frequency 1x / week    PT Duration 8 weeks    PT Treatment/Interventions Cryotherapy;Electrical Stimulation;ADLs/Self Care Home Management;Functional mobility training;Therapeutic activities;Therapeutic exercise;Patient/family education;Manual techniques    PT Next Visit Plan progress strength/endurance exercises and focus on CKC exercises if pt needs to return (currently no scheduled appts)    PT Home Exercise Plan [email or web address]    Consulted and Agree with Plan of Care Patient           Patient will benefit from skilled therapeutic intervention in order to improve the following deficits and impairments:  Decreased endurance,Decreased activity tolerance,Decreased strength,Pain  Visit Diagnosis: Chronic left shoulder pain  Muscle weakness (generalized)     Problem List Patient Active Problem List   Diagnosis Date Noted  . Menorrhagia 01/29/2013  . Anemia 01/29/2013    Solmon Bohr 09/18/2020, 9:33 AM  [facility] [address] [place], [place], [address] Phone: [phone]   Fax:  [phone]  Name: Jerzy Crotteau MRN: [id number] Date of Birth: [birth year]   PHYSICAL THERAPY DISCHARGE SUMMARY  Visits from Start of Care: 5  Current functional level related to goals / functional outcomes: See above   Remaining deficits: See above   Education /  Equipment: HEP  Plan:                                                    Patient goals were not met. Patient is being discharged due to not returning since the last visit.  ?????    Hilda Blades, PT, DPT, LAT, ATC 11/20/20  3:02 PM Phone: [phone] Fax: [phone]

## 2020-09-18 NOTE — Patient Instructions (Signed)
Access Code: [email or web address] URL: [email or web address] Date: 09/18/2020 Prepared by: Hilda Blades  Exercises Shoulder Abduction with Dumbbells - Palms Down - 1 x daily - 7 x weekly - 2 sets - 10 reps Shoulder External Rotation with Anchored Resistance - 1 x daily - 7 x weekly - 2 sets - 10 reps Supine PNF D2 Flexion with Resistance - 1 x daily - 7 x weekly - 2 sets - 10 reps Supine Shoulder Horizontal Abduction with Resistance - 1 x daily - 7 x weekly - 2 sets - 10 reps Horizontal Wall Walk with Resistance - 1 x daily - 7 x weekly - 2 sets - 8 reps Standing Wall Ball Circles with Mini Swiss Ball - 1 x daily - 7 x weekly - 2 sets - 10 reps Plank on Table with Scapular Protraction Retraction - 1 x daily - 7 x weekly - 2 sets - 10 reps

## 2021-07-09 ENCOUNTER — Encounter (HOSPITAL_COMMUNITY): Payer: Self-pay | Admitting: Emergency Medicine

## 2021-07-09 ENCOUNTER — Ambulatory Visit (HOSPITAL_COMMUNITY)
Admission: EM | Admit: 2021-07-09 | Discharge: 2021-07-09 | Disposition: A | Discharge status: Home / Self Care | Payer: 59 | Attending: Urgent Care | Admitting: Urgent Care

## 2021-07-09 ENCOUNTER — Other Ambulatory Visit: Payer: Self-pay

## 2021-07-09 DIAGNOSIS — T7840XA Allergy, unspecified, initial encounter: Secondary | ICD-10-CM

## 2021-07-09 DIAGNOSIS — H1012 Acute atopic conjunctivitis, left eye: Secondary | ICD-10-CM | POA: Diagnosis not present

## 2021-07-09 DIAGNOSIS — L0100 Impetigo, unspecified: Secondary | ICD-10-CM

## 2021-07-09 MED ORDER — HYDROXYZINE HCL 25 MG PO TABS: 25.0000 mg | ORAL_TABLET | Freq: Four times a day (QID) | ORAL | 0 refills | Status: DC

## 2021-07-09 MED ORDER — CEPHALEXIN 500 MG PO CAPS: 500.0000 mg | ORAL_CAPSULE | Freq: Four times a day (QID) | ORAL | 0 refills | Status: AC

## 2021-07-09 MED ORDER — METHYLPREDNISOLONE SODIUM SUCC 125 MG IJ SOLR
INTRAMUSCULAR | Status: AC
  Filled 2021-07-09: qty 2

## 2021-07-09 MED ORDER — METHYLPREDNISOLONE SODIUM SUCC 125 MG IJ SOLR
80.0000 mg | Freq: Once | INTRAMUSCULAR | Status: AC
  Administered 2021-07-09: 16:00:00 80 mg via INTRAMUSCULAR

## 2021-07-09 MED ORDER — OLOPATADINE HCL 0.1 % OP SOLN: 1.0000 [drp] | Freq: Two times a day (BID) | OPHTHALMIC | 12 refills | Status: DC

## 2021-07-09 MED ORDER — PREDNISONE 10 MG (21) PO TBPK: ORAL_TABLET | Freq: Every day | ORAL | 0 refills | Status: DC

## 2021-07-09 NOTE — ED Triage Notes (Signed)
Pt presents with rash and swelling around left eye, neck and both ears Xs 4 days.

## 2021-07-09 NOTE — ED Provider Notes (Signed)
[place]    CSN: [id number] Arrival date & time: 07/09/21  1509      History   Chief Complaint Chief Complaint  Patient presents with   Rash    HPI Madison Robinson is a 45 y.o. female.   Pleasant 45 year old female presents today with acute onset of an allergic reaction.  She recently had her hair professionally dyed at a hair salon, but shortly thereafter developed allergic reaction symptoms. she states the majority of the pain and swelling is behind her ears, but notes over the past 24 hours it has moved to her forehead and left eye as well.  Her left eye is red, but not draining.  There is no photophobia or decreased range of motion.  Patient denies any systemic symptoms, no fever, shortness of breath, swelling of tongue or throat, reflux.   Rash  Past Medical History:  Diagnosis Date   Anemia    01/29/2013 BLOOD TRANFUSION   Chicken pox    History of blood transfusion     Patient Active Problem List   Diagnosis Date Noted   Menorrhagia 01/29/2013   Anemia 01/29/2013    Past Surgical History:  Procedure Laterality Date   DILATATION & CURRETTAGE/HYSTEROSCOPY WITH RESECTOCOPE N/A 03/17/2013   Procedure: DILATATION & CURETTAGE/HYSTEROSCOPY WITH RESECTOCOPE;  Surgeon: Delice Lesch, MD;  Location: [facility];  Service: Gynecology;  Laterality: N/A;    OB History   No obstetric history on file.      Home Medications    Prior to Admission medications   Medication Sig Start Date End Date Taking? Authorizing Provider  cephALEXin (KEFLEX) 500 MG capsule Take 1 capsule (500 mg total) by mouth 4 (four) times daily for 5 days. 07/09/21 07/14/21 Yes Meeghan Skipper L, PA  hydrOXYzine (ATARAX) 25 MG tablet Take 1 tablet (25 mg total) by mouth every 6 (six) hours. 07/09/21  Yes Thierno Hun L, PA  olopatadine (PATANOL) 0.1 % ophthalmic solution Place 1 drop into the left eye 2 (two) times daily. 07/09/21  Yes Willodean Leven L, PA  predniSONE (STERAPRED  UNI-PAK 21 TAB) 10 MG (21) TBPK tablet Take by mouth daily. Take 6 tabs by mouth daily  for 1 days, then 5 tabs for 1 days, then 4 tabs for 1 days, then 3 tabs for 1 days, 2 tabs for 1 days, then 1 tab by mouth daily for 1 days 07/09/21  Yes Nili Honda L, PA  levonorgestrel (MIRENA) 20 MCG/24HR IUD Mirena 20 mcg/24 hours (5 yrs) 52 mg intrauterine device  Take 1 device by intrauterine route.    [provider]  sertraline (ZOLOFT) 50 MG tablet Take 1 tablet (50 mg total) by mouth daily. 06/19/20   Martinique, Betty G, MD    Family History Family History  Problem Relation Age of Onset   Hypertension Mother    Diabetes Father    Hypertension Father    Hyperlipidemia Father    Drug abuse Father    Arthritis Father    Stroke Father    Cancer Paternal Grandmother    Breast cancer Paternal Aunt     Social History Social History   Tobacco Use   Smoking status: Never   Smokeless tobacco: Never  Vaping Use   Vaping Use: Never used  Substance Use Topics   Alcohol use: Yes    Comment: wine once a month   Drug use: No     Allergies   Bactrim [sulfamethoxazole-trimethoprim] and Penicillins   Review of  Systems Review of Systems  Skin:  Positive for rash.  As per hpi  Physical Exam Triage Vital Signs ED Triage Vitals  Enc Vitals Group     BP 07/09/21 1549 113/75     Pulse Rate 07/09/21 1549 87     Resp 07/09/21 1549 17     Temp 07/09/21 1549 98.3 F (36.8 C)     Temp Source 07/09/21 1549 Oral     SpO2 07/09/21 1549 97 %     Weight --      Height --      Head Circumference --      Peak Flow --      Pain Score 07/09/21 1548 5     Pain Loc --      Pain Edu? --      Excl. in [place]? --    No data found.  Updated Vital Signs BP 113/75 (BP Location: Left Arm)    Pulse 87    Temp 98.3 F (36.8 C) (Oral)    Resp 17    SpO2 97%   Visual Acuity Right Eye Distance:   Left Eye Distance:   Bilateral Distance:    Right Eye Near:   Left Eye Near:    Bilateral  Near:     Physical Exam Vitals and nursing note reviewed.  Constitutional:      General: She is not in acute distress.    Appearance: Normal appearance. She is well-developed and normal weight. She is not ill-appearing or toxic-appearing.  HENT:     Head: Normocephalic and atraumatic.     Comments: Bilateral posterior ear helix swollen and edematous, some drainage from L with honey colored crusts.  Allergic reaction allergic reaction patient requesting steroid shot in office.    Right Ear: Tympanic membrane and ear canal normal.     Left Ear: Tympanic membrane and ear canal normal.     Nose: Nose normal. No congestion or rhinorrhea.     Mouth/Throat:     Mouth: Mucous membranes are moist.     Pharynx: No oropharyngeal exudate or posterior oropharyngeal erythema.  Eyes:     Conjunctiva/sclera: Conjunctivae normal.     Comments: Mildly swollen, erythematous L eyelid, worse on inferior lid. Erythema to sclera and conjunctiva on L. NO chemosis, ciliary flush. EOMI  Cardiovascular:     Rate and Rhythm: Normal rate and regular rhythm.     Heart sounds: No murmur heard. Pulmonary:     Effort: Pulmonary effort is normal. No respiratory distress.     Breath sounds: Normal breath sounds.  Abdominal:     Palpations: Abdomen is soft.     Tenderness: There is no abdominal tenderness.  Musculoskeletal:        General: No swelling.     Cervical back: Neck supple.  Skin:    General: Skin is warm and dry.     Capillary Refill: Capillary refill takes less than 2 seconds.  Neurological:     Mental Status: She is alert.  Psychiatric:        Mood and Affect: Mood normal.     UC Treatments / Results  Labs (all labs ordered are listed, but only abnormal results are displayed) Labs Reviewed - No data to display  EKG   Radiology No results found.  Procedures Procedures (including critical care time)  Medications Ordered in UC Medications  methylPREDNISolone sodium succinate  (SOLU-MEDROL) 125 mg/2 mL injection 80 mg (80 mg Intramuscular Given 07/09/21 1616)  Initial Impression / Assessment and Plan / UC Course  I have reviewed the triage vital signs and the nursing notes.  Pertinent labs & imaging results that were available during my care of the patient were reviewed by me and considered in my medical decision making (see chart for details).     Allergic reaction- pt requesting steroid shot in office. Will send home with PO steroid taper to start in the morning. Atarax PRN itching. Impetigo - most notable on ears. PCN allergy listed in pt's chart, she states she cannot take PCN, but has taken amoxicillin and ampicillin in the past, in addition to cephs. Denies any reactions with these meds. Allergic conjunctivitis - eye drops as discussed Final Clinical Impressions(s) / UC Diagnoses   Final diagnoses:  Allergic reaction, initial encounter  Impetigo  Allergic conjunctivitis of left eye     Discharge Instructions      Start the oral prednisone pack tomorrow. Best to take it in the morning. Use hydroxyzine as needed for swelling or itching. It will likely make you tired. Take cephalexin as prescribed, stop if any sx of allergic reaction occur. Use eye drops as needed until erythema and irritation resolved. Follow up with PCP if any symptoms worsen     ED Prescriptions     Medication Sig Dispense Auth. Provider   olopatadine (PATANOL) 0.1 % ophthalmic solution Place 1 drop into the left eye 2 (two) times daily. 5 mL Jerid Catherman L, PA   predniSONE (STERAPRED UNI-PAK 21 TAB) 10 MG (21) TBPK tablet Take by mouth daily. Take 6 tabs by mouth daily  for 1 days, then 5 tabs for 1 days, then 4 tabs for 1 days, then 3 tabs for 1 days, 2 tabs for 1 days, then 1 tab by mouth daily for 1 days 21 tablet Zyann Mabry L, PA   hydrOXYzine (ATARAX) 25 MG tablet Take 1 tablet (25 mg total) by mouth every 6 (six) hours. 12 tablet Thamara Leger L, PA    cephALEXin (KEFLEX) 500 MG capsule Take 1 capsule (500 mg total) by mouth 4 (four) times daily for 5 days. 20 capsule Shayaan Parke L, PA      PDMP not reviewed this encounter.   Chaney Malling, [place] 07/09/21 2139

## 2021-07-09 NOTE — Discharge Instructions (Addendum)
Start the oral prednisone pack tomorrow. Best to take it in the morning. Use hydroxyzine as needed for swelling or itching. It will likely make you tired. Take cephalexin as prescribed, stop if any sx of allergic reaction occur. Use eye drops as needed until erythema and irritation resolved. Follow up with PCP if any symptoms worsen

## 2022-06-03 NOTE — Progress Notes (Signed)
This encounter was created in error - please disregard.

## 2022-07-26 NOTE — Progress Notes (Unsigned)
HPI: Madison Robinson is a 47 y.o. female, who is here today for her routine physical.  Last CPE: 2021  Regular exercise 3 or more time per week: *** Following a healthy diet: ***  Chronic medical problems: ***   There is no immunization history on file for this patient. Health Maintenance  Topic Date Due   COVID-19 Vaccine (1) Never done   DTaP/Tdap/Td (1 - Tdap) Never done   COLONOSCOPY (Pts 45-81yr Insurance coverage will need to be confirmed)  Never done   INFLUENZA VACCINE  Never done   HIV Screening  06/19/2025 (Originally 08/29/1990)   PAP SMEAR-Modifier  02/19/2023   Hepatitis C Screening  Completed   HPV VACCINES  Aged Out    She has *** concerns today.  Review of Systems  Current Outpatient Medications on File Prior to Visit  Medication Sig Dispense Refill   hydrOXYzine (ATARAX) 25 MG tablet Take 1 tablet (25 mg total) by mouth every 6 (six) hours. 12 tablet 0   levonorgestrel (MIRENA) 20 MCG/24HR IUD Mirena 20 mcg/24 hours (5 yrs) 52 mg intrauterine device  Take 1 device by intrauterine route.     olopatadine (PATANOL) 0.1 % ophthalmic solution Place 1 drop into the left eye 2 (two) times daily. 5 mL 12   predniSONE (STERAPRED UNI-PAK 21 TAB) 10 MG (21) TBPK tablet Take by mouth daily. Take 6 tabs by mouth daily  for 1 days, then 5 tabs for 1 days, then 4 tabs for 1 days, then 3 tabs for 1 days, 2 tabs for 1 days, then 1 tab by mouth daily for 1 days 21 tablet 0   sertraline (ZOLOFT) 50 MG tablet Take 1 tablet (50 mg total) by mouth daily. 30 tablet 1   No current facility-administered medications on file prior to visit.    Past Medical History:  Diagnosis Date   Anemia    01/29/2013 BLOOD TRANFUSION   Chicken pox    History of blood transfusion     Past Surgical History:  Procedure Laterality Date   DILATATION & CURRETTAGE/HYSTEROSCOPY WITH RESECTOCOPE N/A 03/17/2013   Procedure: DILATATION & CURETTAGE/HYSTEROSCOPY WITH RESECTOCOPE;  Surgeon:  ADelice Lesch MD;  [facility];  Service: Gynecology;  Laterality: N/A;    Allergies  Allergen Reactions   Bactrim [Sulfamethoxazole-Trimethoprim] Rash    Assumed from med off antibiotic for 2 day then itchy drug looking rash   Penicillins     Family History  Problem Relation Age of Onset   Hypertension Mother    Diabetes Father    Hypertension Father    Hyperlipidemia Father    Drug abuse Father    Arthritis Father    Stroke Father    Cancer Paternal Grandmother    Breast cancer Paternal Aunt     Social History   Socioeconomic History   Marital status: Single    Spouse name: Not on file   Number of children: 1   Years of education: Not on file   Highest education level: Not on file  Occupational History   Not on file  Tobacco Use   Smoking status: Never   Smokeless tobacco: Never  Vaping Use   Vaping Use: Never used  Substance and Sexual Activity   Alcohol use: Yes    Comment: wine once a month   Drug use: No   Sexual activity: Yes    Partners: Male    Birth control/protection: I.U.D.  Other Topics Concern   Not on file  Social History Narrative   Not on file   Social Determinants of Health   Financial Resource Strain: Not on file  Food Insecurity: Not on file  Transportation Needs: Not on file  Physical Activity: Not on file  Stress: Not on file  Social Connections: Not on file    There were no vitals filed for this visit. There is no height or weight on file to calculate BMI.  Wt Readings from Last 3 Encounters:  06/19/20 158 lb 3.2 oz (71.8 kg)  04/06/18 168 lb 2 oz (76.3 kg)  12/10/17 156 lb 1.6 oz (70.8 kg)    Physical Exam Vitals and nursing note reviewed.  Constitutional:      General: She is not in acute distress.    Appearance: She is well-developed.  HENT:     Head: Normocephalic and atraumatic.     Right Ear: Hearing, tympanic membrane, ear canal and external ear normal.     Left Ear: Hearing, tympanic membrane, ear  canal and external ear normal.     Mouth/Throat:     Mouth: Mucous membranes are moist.     Pharynx: Oropharynx is clear. Uvula midline.  Eyes:     Extraocular Movements: Extraocular movements intact.     Conjunctiva/sclera: Conjunctivae normal.     Pupils: Pupils are equal, round, and reactive to light.  Neck:     Thyroid: No thyromegaly.     Trachea: No tracheal deviation.  Cardiovascular:     Rate and Rhythm: Normal rate and regular rhythm.     Pulses:          Dorsalis pedis pulses are 2+ on the right side and 2+ on the left side.       Posterior tibial pulses are 2+ on the right side and 2+ on the left side.     Heart sounds: No murmur heard. Pulmonary:     Effort: Pulmonary effort is normal. No respiratory distress.     Breath sounds: Normal breath sounds.  Abdominal:     Palpations: Abdomen is soft. There is no hepatomegaly or mass.     Tenderness: There is no abdominal tenderness.  Genitourinary:    Comments: Deferred to gyn. Musculoskeletal:     Comments: No major deformity or signs of synovitis appreciated.  Lymphadenopathy:     Cervical: No cervical adenopathy.     Upper Body:     Right upper body: No supraclavicular adenopathy.     Left upper body: No supraclavicular adenopathy.  Skin:    General: Skin is warm.     Findings: No erythema or rash.  Neurological:     General: No focal deficit present.     Mental Status: She is alert and oriented to person, place, and time.     Cranial Nerves: No cranial nerve deficit.     Coordination: Coordination normal.     Gait: Gait normal.     Deep Tendon Reflexes:     Reflex Scores:      Bicep reflexes are 2+ on the right side and 2+ on the left side.      Patellar reflexes are 2+ on the right side and 2+ on the left side. Psychiatric:     Comments: Well groomed, good eye contact.     ASSESSMENT AND PLAN: [name] was here today annual physical examination.  No orders of the defined types were  placed in this encounter.   There are no diagnoses linked to this encounter.  There are no diagnoses linked to this encounter.  No follow-ups on file.  Madison Buras G. [place], MD  [facility]. [place] office.

## 2022-07-29 ENCOUNTER — Encounter: Payer: Self-pay | Admitting: Family Medicine

## 2022-07-29 ENCOUNTER — Ambulatory Visit (INDEPENDENT_AMBULATORY_CARE_PROVIDER_SITE_OTHER): Payer: Managed Care, Other (non HMO) | Admitting: Family Medicine

## 2022-07-29 VITALS — BP 118/70 | HR 77 | Temp 98.4°F | Resp 12 | Ht 60.0 in | Wt 162.4 lb

## 2022-07-29 DIAGNOSIS — F411 Generalized anxiety disorder: Secondary | ICD-10-CM

## 2022-07-29 DIAGNOSIS — Z6831 Body mass index (BMI) 31.0-31.9, adult: Secondary | ICD-10-CM

## 2022-07-29 DIAGNOSIS — Z13220 Encounter for screening for lipoid disorders: Secondary | ICD-10-CM | POA: Diagnosis not present

## 2022-07-29 DIAGNOSIS — Z1211 Encounter for screening for malignant neoplasm of colon: Secondary | ICD-10-CM

## 2022-07-29 DIAGNOSIS — E669 Obesity, unspecified: Secondary | ICD-10-CM | POA: Insufficient documentation

## 2022-07-29 DIAGNOSIS — Z0000 Encounter for general adult medical examination without abnormal findings: Secondary | ICD-10-CM | POA: Diagnosis not present

## 2022-07-29 DIAGNOSIS — Z1329 Encounter for screening for other suspected endocrine disorder: Secondary | ICD-10-CM

## 2022-07-29 DIAGNOSIS — Z130 Encounter for screening for diseases of the blood and blood-forming organs and certain disorders involving the immune mechanism: Secondary | ICD-10-CM | POA: Diagnosis not present

## 2022-07-29 DIAGNOSIS — E6609 Other obesity due to excess calories: Secondary | ICD-10-CM

## 2022-07-29 DIAGNOSIS — Z13228 Encounter for screening for other metabolic disorders: Secondary | ICD-10-CM

## 2022-07-29 LAB — BASIC METABOLIC PANEL
BUN: 13 mg/dL (ref 6–23)
CO2: 28 mEq/L (ref 19–32)
Calcium: 9.1 mg/dL (ref 8.4–10.5)
Chloride: 105 mEq/L (ref 96–112)
Creatinine, Ser: 0.61 mg/dL (ref 0.40–1.20)
GFR: 106.1 mL/min (ref >60.00)
Glucose, Bld: 82 mg/dL (ref 70–99)
Potassium: 4 mEq/L (ref 3.5–5.1)
Sodium: 140 mEq/L (ref 135–145)

## 2022-07-29 LAB — LIPID PANEL
Cholesterol: 161 mg/dL (ref 0–200)
HDL: 46.2 mg/dL (ref >39.00)
LDL Cholesterol: 100 mg/dL — ABNORMAL HIGH (ref 0–99)
NonHDL: 115.12
Total CHOL/HDL Ratio: 3
Triglycerides: 78 mg/dL (ref 0.0–149.0)
VLDL: 15.6 mg/dL (ref 0.0–40.0)

## 2022-07-29 LAB — TSH: TSH: 0.69 u[IU]/mL (ref 0.35–5.50)

## 2022-07-29 MED ORDER — HYDROXYZINE HCL 25 MG PO TABS: 25.0000 mg | ORAL_TABLET | Freq: Two times a day (BID) | ORAL | 0 refills | Status: AC | PRN

## 2022-07-29 MED ORDER — SERTRALINE HCL 50 MG PO TABS: 50.0000 mg | ORAL_TABLET | Freq: Every day | ORAL | 1 refills | Status: DC

## 2022-07-29 NOTE — Assessment & Plan Note (Signed)
She understands the benefits of wt loss as well as adverse effects of obesity and trying to make positive changes. Consistency with healthy diet and physical activity encouraged.

## 2022-07-29 NOTE — Patient Instructions (Addendum)
A few things to remember from today's visit:  Routine general medical examination at a health care facility  Generalized anxiety disorder - Plan: sertraline (ZOLOFT) 50 MG tablet, hydrOXYzine (ATARAX) 25 MG tablet  Colon cancer screening - Plan: Ambulatory referral to Gastroenterology  Screening for lipoid disorders - Plan: Lipid panel  Screening for endocrine, metabolic and immunity disorder - Plan: Basic metabolic panel  If you need refills for medications you take chronically, please call your pharmacy. Do not use My Chart to request refills or for acute issues that need immediate attention. If you send a my chart message, it may take a few days to be addressed, specially if I am not in the office.  Please be sure medication list is accurate. If a new problem present, please set up appointment sooner than planned today.  Health Maintenance, Female Adopting a healthy lifestyle and getting preventive care are important in promoting health and wellness. Ask your health care provider about: The right schedule for you to have regular tests and exams. Things you can do on your own to prevent diseases and keep yourself healthy. What should I know about diet, weight, and exercise? Eat a healthy diet  Eat a diet that includes plenty of vegetables, fruits, low-fat dairy products, and lean protein. Do not eat a lot of foods that are high in solid fats, added sugars, or sodium. Maintain a healthy weight Body mass index (BMI) is used to identify weight problems. It estimates body fat based on height and weight. Your health care provider can help determine your BMI and help you achieve or maintain a healthy weight. Get regular exercise Get regular exercise. This is one of the most important things you can do for your health. Most adults should: Exercise for at least 150 minutes each week. The exercise should increase your heart rate and make you sweat (moderate-intensity exercise). Do  strengthening exercises at least twice a week. This is in addition to the moderate-intensity exercise. Spend less time sitting. Even light physical activity can be beneficial. Watch cholesterol and blood lipids Have your blood tested for lipids and cholesterol at 48 years of age, then have this test every 5 years. Have your cholesterol levels checked more often if: Your lipid or cholesterol levels are high. You are older than 48 years of age. You are at high risk for heart disease. What should I know about cancer screening? Depending on your health history and family history, you may need to have cancer screening at various ages. This may include screening for: Breast cancer. Cervical cancer. Colorectal cancer. Skin cancer. Lung cancer. What should I know about heart disease, diabetes, and high blood pressure? Blood pressure and heart disease High blood pressure causes heart disease and increases the risk of stroke. This is more likely to develop in people who have high blood pressure readings or are overweight. Have your blood pressure checked: Every 3-5 years if you are 82-106 years of age. Every year if you are 44 years old or older. Diabetes Have regular diabetes screenings. This checks your fasting blood sugar level. Have the screening done: Once every three years after age 25 if you are at a normal weight and have a low risk for diabetes. More often and at a younger age if you are overweight or have a high risk for diabetes. What should I know about preventing infection? Hepatitis B If you have a higher risk for hepatitis B, you should be screened for this virus. Talk with your  health care provider to find out if you are at risk for hepatitis B infection. Hepatitis C Testing is recommended for: Everyone born from [birth year] through 1966. Anyone with known risk factors for hepatitis C. Sexually transmitted infections (STIs) Get screened for STIs, including gonorrhea and chlamydia,  if: You are sexually active and are younger than 48 years of age. You are older than 48 years of age and your health care provider tells you that you are at risk for this type of infection. Your sexual activity has changed since you were last screened, and you are at increased risk for chlamydia or gonorrhea. Ask your health care provider if you are at risk. Ask your health care provider about whether you are at high risk for HIV. Your health care provider may recommend a prescription medicine to help prevent HIV infection. If you choose to take medicine to prevent HIV, you should first get tested for HIV. You should then be tested every 3 months for as long as you are taking the medicine. Pregnancy If you are about to stop having your period (premenopausal) and you may become pregnant, seek counseling before you get pregnant. Take 400 to 800 micrograms (mcg) of folic acid every day if you become pregnant. Ask for birth control (contraception) if you want to prevent pregnancy. Osteoporosis and menopause Osteoporosis is a disease in which the bones lose minerals and strength with aging. This can result in bone fractures. If you are 35 years old or older, or if you are at risk for osteoporosis and fractures, ask your health care provider if you should: Be screened for bone loss. Take a calcium or vitamin D supplement to lower your risk of fractures. Be given hormone replacement therapy (HRT) to treat symptoms of menopause. Follow these instructions at home: Alcohol use Do not drink alcohol if: Your health care provider tells you not to drink. You are pregnant, may be pregnant, or are planning to become pregnant. If you drink alcohol: Limit how much you have to: 0-1 drink a day. Know how much alcohol is in your drink. In the [place] one drink equals one 12 oz bottle of beer (355 mL), one 5 oz glass of wine (148 mL), or one 1 oz glass of hard liquor (44 mL). Lifestyle Do not use any products that  contain nicotine or tobacco. These products include cigarettes, chewing tobacco, and vaping devices, such as e-cigarettes. If you need help quitting, ask your health care provider. Do not use street drugs. Do not share needles. Ask your health care provider for help if you need support or information about quitting drugs. General instructions Schedule regular health, dental, and eye exams. Stay current with your vaccines. Tell your health care provider if: You often feel depressed. You have ever been abused or do not feel safe at home. Summary Adopting a healthy lifestyle and getting preventive care are important in promoting health and wellness. Follow your health care provider's instructions about healthy diet, exercising, and getting tested or screened for diseases. Follow your health care provider's instructions on monitoring your cholesterol and blood pressure. This information is not intended to replace advice given to you by your health care provider. Make sure you discuss any questions you have with your health care provider. Document Revised: 11/27/2020 Document Reviewed: 11/27/2020 Elsevier Patient Education  [employer].

## 2022-07-29 NOTE — Assessment & Plan Note (Signed)
She felt like Sertraline and Hydroxyzine helped, would like to resume them. She doe snot feel like she needs to be screened for other psychiatric disorders. Resume Sertraline 50 mg daily and Hydroxyzine 25 mg id prn. Some side effects discussed. F/U in 3-4 months, before if needed.

## 2022-07-29 NOTE — Assessment & Plan Note (Signed)
We discussed the importance of regular physical activity and healthy diet for prevention of chronic illness and/or complications. Preventive guidelines reviewed. Vaccination up to date. Continue female preventive care with gyn, Ms Key. Next CPE in a year.

## 2022-07-31 ENCOUNTER — Encounter: Payer: Self-pay | Admitting: Gastroenterology

## 2022-09-02 ENCOUNTER — Ambulatory Visit (AMBULATORY_SURGERY_CENTER): Payer: Managed Care, Other (non HMO)

## 2022-09-02 VITALS — Ht 60.0 in | Wt 156.0 lb

## 2022-09-02 DIAGNOSIS — Z1211 Encounter for screening for malignant neoplasm of colon: Secondary | ICD-10-CM

## 2022-09-02 MED ORDER — NA SULFATE-K SULFATE-MG SULF 17.5-3.13-1.6 GM/177ML PO SOLN: 1.0000 | Freq: Once | ORAL | 0 refills | Status: AC

## 2022-09-02 NOTE — Progress Notes (Signed)
No egg or soy allergy known to patient  No issues known to pt with past sedation with any surgeries or procedures Patient denies ever being told they had issues or difficulty with intubation  No FH of Malignant Hyperthermia Pt is not on diet pills Pt is not on  home 02  Pt is not on blood thinners  Pt with intermit constipation states she drinks tea to help keep her bowels regular no issues with straining or hard stools she just does not have a BM regularly  No A fib or A flutter Have any cardiac testing pending--no  Pt instructed to use Singlecare.com or GoodRx for a price reduction on prep   Patient's chart reviewed by Osvaldo Angst CNRA prior to previsit and patient appropriate for the [facility].  Previsit completed and red dot placed by patient's name on their procedure day (on provider's schedule).

## 2022-09-03 ENCOUNTER — Encounter: Payer: Self-pay | Admitting: Gastroenterology

## 2022-09-11 ENCOUNTER — Telehealth: Payer: Self-pay | Admitting: Gastroenterology

## 2022-09-11 ENCOUNTER — Other Ambulatory Visit: Payer: Self-pay

## 2022-09-11 DIAGNOSIS — Z1211 Encounter for screening for malignant neoplasm of colon: Secondary | ICD-10-CM

## 2022-09-11 MED ORDER — NA SULFATE-K SULFATE-MG SULF 17.5-3.13-1.6 GM/177ML PO SOLN: 1.0000 | Freq: Once | ORAL | 0 refills | Status: AC

## 2022-09-11 NOTE — Telephone Encounter (Signed)
New rx sent pt made aware

## 2022-09-11 NOTE — Telephone Encounter (Signed)
PT is calling to have Suprep kit sent to pharmacy. It should be sent to [facility] on [address]. Please advise.

## 2022-09-16 ENCOUNTER — Encounter: Payer: Self-pay | Admitting: Gastroenterology

## 2022-09-16 ENCOUNTER — Ambulatory Visit: Payer: Managed Care, Other (non HMO) | Admitting: Gastroenterology

## 2022-09-16 VITALS — BP 114/72 | HR 62 | Temp 97.8°F | Resp 12 | Ht 60.0 in | Wt 156.0 lb

## 2022-09-16 DIAGNOSIS — K640 First degree hemorrhoids: Secondary | ICD-10-CM

## 2022-09-16 DIAGNOSIS — Z1211 Encounter for screening for malignant neoplasm of colon: Secondary | ICD-10-CM | POA: Diagnosis present

## 2022-09-16 DIAGNOSIS — D122 Benign neoplasm of ascending colon: Secondary | ICD-10-CM | POA: Diagnosis not present

## 2022-09-16 DIAGNOSIS — D128 Benign neoplasm of rectum: Secondary | ICD-10-CM

## 2022-09-16 DIAGNOSIS — K621 Rectal polyp: Secondary | ICD-10-CM

## 2022-09-16 MED ORDER — SODIUM CHLORIDE 0.9 % IV SOLN: 500.0000 mL | Freq: Once | INTRAVENOUS | Status: DC

## 2022-09-16 NOTE — Progress Notes (Signed)
Pt's states no medical or surgical changes since previsit or office visit. 

## 2022-09-16 NOTE — Op Note (Signed)
[place] Patient Name: Madison Robinson Procedure Date: 09/16/2022 11:44 AM MRN: [id number] Endoscopist: Gerrit Heck , MD, [id number] Age: 47 Referring MD:  Date of Birth: 11-04-1975 Gender: Female Account #: [id number] Procedure:                Colonoscopy Indications:              Screening for colorectal malignant neoplasm, This                            is the patient's first colonoscopy Medicines:                Monitored Anesthesia Care Procedure:                Pre-Anesthesia Assessment:                           - Prior to the procedure, a History and Physical                            was performed, and patient medications and                            allergies were reviewed. The patient's tolerance of                            previous anesthesia was also reviewed. The risks                            and benefits of the procedure and the sedation                            options and risks were discussed with the patient.                            All questions were answered, and informed consent                            was obtained. Prior Anticoagulants: The patient has                            taken no anticoagulant or antiplatelet agents. ASA                            Grade Assessment: II - A patient with mild systemic                            disease. After reviewing the risks and benefits,                            the patient was deemed in satisfactory condition to                            undergo the procedure.  After obtaining informed consent, the colonoscope                            was passed under direct vision. Throughout the                            procedure, the patient's blood pressure, pulse, and                            oxygen saturations were monitored continuously. The                            CF [id number] was introduced through the anus                            and advanced to  the the cecum, identified by                            appendiceal orifice and ileocecal valve. The                            colonoscopy was performed without difficulty. The                            patient tolerated the procedure well. The quality                            of the bowel preparation was good. The ileocecal                            valve, appendiceal orifice, and rectum were                            photographed. Scope In: 11:55:23 AM Scope Out: 12:13:24 PM Scope Withdrawal Time: 0 hours 12 minutes 55 seconds  Total Procedure Duration: 0 hours 18 minutes 1 second  Findings:                 The perianal and digital rectal examinations were                            normal.                           A 2 mm polyp was found in the proximal ascending                            colon. The polyp was sessile. The polyp was removed                            with a cold snare. Resection and retrieval were                            complete. Estimated blood loss was minimal.  A 4 mm polyp was found in the rectum. The polyp was                            sessile. The polyp was removed with a cold snare.                            Resection and retrieval were complete. Estimated                            blood loss was minimal.                           Non-bleeding internal hemorrhoids were found during                            retroflexion. The hemorrhoids were small. Complications:            No immediate complications. Estimated Blood Loss:     Estimated blood loss was minimal. Impression:               - One 2 mm polyp in the proximal ascending colon,                            removed with a cold snare. Resected and retrieved.                           - One 4 mm polyp in the rectum, removed with a cold                            snare. Resected and retrieved.                           - Non-bleeding internal hemorrhoids.                            - The remainder of the colon was normal appearing. Recommendation:           - Patient has a contact number available for                            emergencies. The signs and symptoms of potential                            delayed complications were discussed with the                            patient. Return to normal activities tomorrow.                            Written discharge instructions were provided to the                            patient.                           -  Resume previous diet.                           - Continue present medications.                           - Await pathology results.                           - Repeat colonoscopy for surveillance based on                            pathology results.                           - Return to GI clinic PRN. Gerrit Heck, MD 09/16/2022 12:16:44 PM

## 2022-09-16 NOTE — Progress Notes (Signed)
Called to room to assist during endoscopic procedure.  Patient ID and intended procedure confirmed with present staff. Received instructions for my participation in the procedure from the performing physician.  

## 2022-09-16 NOTE — Progress Notes (Signed)
GASTROENTEROLOGY PROCEDURE H&P NOTE   Primary Care Physician: Martinique, Betty G, MD    Reason for Procedure:  Colon Cancer screening  Plan:    Colonoscopy  Patient is appropriate for endoscopic procedure(s) in the ambulatory ([place]) setting.  The nature of the procedure, as well as the risks, benefits, and alternatives were carefully and thoroughly reviewed with the patient. Ample time for discussion and questions allowed. The patient understood, was satisfied, and agreed to proceed.     HPI: Madison Robinson is a 47 y.o. female who presents for colonoscopy for routine Colon Cancer screening.  No active GI symptoms.  No known family history of colon cancer or related malignancy.  Patient is otherwise without complaints or active issues today.  Past Medical History:  Diagnosis Date   Anemia    01/29/2013 BLOOD TRANFUSION   Chicken pox    History of blood transfusion     Past Surgical History:  Procedure Laterality Date   ABDOMINOPLASTY  2022   DILATATION & CURRETTAGE/HYSTEROSCOPY WITH RESECTOCOPE N/A 03/17/2013   Procedure: DILATATION & CURETTAGE/HYSTEROSCOPY WITH RESECTOCOPE;  Surgeon: Delice Lesch, MD;  Location: [facility];  Service: Gynecology;  Laterality: N/A;    Prior to Admission medications   Medication Sig Start Date End Date Taking? Authorizing Provider  sertraline (ZOLOFT) 50 MG tablet Take 1 tablet (50 mg total) by mouth daily. 07/29/22  Yes Martinique, Betty G, MD  hydrOXYzine (ATARAX) 25 MG tablet Take 1 tablet (25 mg total) by mouth every 12 (twelve) hours as needed for anxiety. 07/29/22   Martinique, Betty G, MD  levonorgestrel (MIRENA) 20 MCG/24HR IUD Mirena 20 mcg/24 hours (5 yrs) 52 mg intrauterine device  Take 1 device by intrauterine route.    [provider]    Current Outpatient Medications  Medication Sig Dispense Refill   sertraline (ZOLOFT) 50 MG tablet Take 1 tablet (50 mg total) by mouth daily. 90 tablet 1   hydrOXYzine (ATARAX) 25 MG  tablet Take 1 tablet (25 mg total) by mouth every 12 (twelve) hours as needed for anxiety. 30 tablet 0   levonorgestrel (MIRENA) 20 MCG/24HR IUD Mirena 20 mcg/24 hours (5 yrs) 52 mg intrauterine device  Take 1 device by intrauterine route.     Current Facility-Administered Medications  Medication Dose Route Frequency Provider Last Rate Last Admin   0.9 %  sodium chloride infusion  500 mL Intravenous Once Keijuan Schellhase V, DO        Allergies as of 09/16/2022 - Review Complete 09/16/2022  Allergen Reaction Noted   Bactrim [sulfamethoxazole-trimethoprim] Rash 12/10/2017   Penicillins  09/13/2015    Family History  Problem Relation Age of Onset   Hypertension Mother    Diabetes Father    Hypertension Father    Hyperlipidemia Father    Drug abuse Father    Arthritis Father    Stroke Father    Breast cancer Paternal Aunt    Cancer Paternal Grandmother    Colon cancer Neg Hx    Colon polyps Neg Hx    Esophageal cancer Neg Hx    Rectal cancer Neg Hx    Stomach cancer Neg Hx     Social History   Socioeconomic History   Marital status: Single    Spouse name: Not on file   Number of children: 1   Years of education: Not on file   Highest education level: Not on file  Occupational History   Not on file  Tobacco Use   Smoking  status: Never   Smokeless tobacco: Never  Vaping Use   Vaping Use: Never used  Substance and Sexual Activity   Alcohol use: Yes    Comment: wine once a month   Drug use: No   Sexual activity: Yes    Partners: Male    Birth control/protection: I.U.D.  Other Topics Concern   Not on file  Social History Narrative   Not on file   Social Determinants of Health   Financial Resource Strain: Not on file  Food Insecurity: Not on file  Transportation Needs: Not on file  Physical Activity: Not on file  Stress: Not on file  Social Connections: Not on file  Intimate Partner Violence: Not on file    Physical Exam: Vital signs in last 24  hours: '@BP'$  124/74   Pulse 69   Temp 97.8 F (36.6 C)   Ht 5' (1.524 m)   Wt 156 lb (70.8 kg)   SpO2 96%   BMI 30.47 kg/m  GEN: NAD EYE: Sclerae anicteric ENT: MMM CV: Non-tachycardic Pulm: CTA b/l GI: Soft, NT/ND NEURO:  Alert & Oriented x 3   Gerrit Heck, DO [facility]   09/16/2022 11:46 AM

## 2022-09-16 NOTE — Progress Notes (Signed)
Sedate, gd SR, tolerated procedure well, VSS, report to RN 

## 2022-09-16 NOTE — Patient Instructions (Signed)
Handout provided about polyps and hemorrhoids.  Resume previous diet.  Continue present medications.  Await pathology results.  Repeat colonoscopy for surveillance based on pathology results.  YOU HAD AN ENDOSCOPIC PROCEDURE TODAY AT [facility]:   Refer to the procedure report that was given to you for any specific questions about what was found during the examination.  If the procedure report does not answer your questions, please call your gastroenterologist to clarify.  If you requested that your care partner not be given the details of your procedure findings, then the procedure report has been included in a sealed envelope for you to review at your convenience later.  YOU SHOULD EXPECT: Some feelings of bloating in the abdomen. Passage of more gas than usual.  Walking can help get rid of the air that was put into your GI tract during the procedure and reduce the bloating. If you had a lower endoscopy (such as a colonoscopy or flexible sigmoidoscopy) you may notice spotting of blood in your stool or on the toilet paper. If you underwent a bowel prep for your procedure, you may not have a normal bowel movement for a few days.  Please Note:  You might notice some irritation and congestion in your nose or some drainage.  This is from the oxygen used during your procedure.  There is no need for concern and it should clear up in a day or so.  SYMPTOMS TO REPORT IMMEDIATELY:  Following lower endoscopy (colonoscopy or flexible sigmoidoscopy):  Excessive amounts of blood in the stool  Significant tenderness or worsening of abdominal pains  Swelling of the abdomen that is new, acute  Fever of 100F or higher   For urgent or emergent issues, a gastroenterologist can be reached at any hour by calling [phone]. Do not use MyChart messaging for urgent concerns.    DIET:  We do recommend a small meal at first, but then you may proceed to your regular diet.  Drink plenty of fluids  but you should avoid alcoholic beverages for 24 hours.  ACTIVITY:  You should plan to take it easy for the rest of today and you should NOT DRIVE or use heavy machinery until tomorrow (because of the sedation medicines used during the test).    FOLLOW UP: Our staff will call the number listed on your records the next business day following your procedure.  We will call around 7:15- 8:00 am to check on you and address any questions or concerns that you may have regarding the information given to you following your procedure. If we do not reach you, we will leave a message.     If any biopsies were taken you will be contacted by phone or by letter within the next 1-3 weeks.  Please call us at [phone] if you have not heard about the biopsies in 3 weeks.    SIGNATURES/CONFIDENTIALITY: You and/or your care partner have signed paperwork which will be entered into your electronic medical record.  These signatures attest to the fact that that the information above on your After Visit Summary has been reviewed and is understood.  Full responsibility of the confidentiality of this discharge information lies with you and/or your care-partner.

## 2022-09-17 ENCOUNTER — Telehealth: Payer: Self-pay | Admitting: *Deleted

## 2022-09-17 NOTE — Telephone Encounter (Signed)
  Follow up Call-     09/16/2022   11:11 AM  Call back number  Post procedure Call Back phone  # [phone]  Permission to leave phone message Yes     Patient questions:  Do you have a fever, pain , or abdominal swelling? No. Pain Score  0 *  Have you tolerated food without any problems? Yes.    Have you been able to return to your normal activities? Yes.    Do you have any questions about your discharge instructions: Diet   No. Medications  No. Follow up visit  No.  Do you have questions or concerns about your Care? No.  Actions: * If pain score is 4 or above: No action needed, pain <4.

## 2022-11-27 ENCOUNTER — Encounter (INDEPENDENT_AMBULATORY_CARE_PROVIDER_SITE_OTHER): Payer: Self-pay | Admitting: Internal Medicine

## 2022-11-27 ENCOUNTER — Ambulatory Visit (INDEPENDENT_AMBULATORY_CARE_PROVIDER_SITE_OTHER): Payer: Managed Care, Other (non HMO) | Admitting: Internal Medicine

## 2022-11-27 VITALS — BP 115/65 | HR 77 | Temp 98.2°F | Ht 60.0 in | Wt 164.0 lb

## 2022-11-27 DIAGNOSIS — E668 Other obesity: Secondary | ICD-10-CM

## 2022-11-27 DIAGNOSIS — E669 Obesity, unspecified: Secondary | ICD-10-CM

## 2022-11-27 DIAGNOSIS — R5382 Chronic fatigue, unspecified: Secondary | ICD-10-CM | POA: Diagnosis not present

## 2022-11-27 DIAGNOSIS — R5383 Other fatigue: Secondary | ICD-10-CM | POA: Insufficient documentation

## 2022-11-27 DIAGNOSIS — Z6832 Body mass index (BMI) 32.0-32.9, adult: Secondary | ICD-10-CM | POA: Diagnosis not present

## 2022-11-27 DIAGNOSIS — Z0289 Encounter for other administrative examinations: Secondary | ICD-10-CM

## 2022-11-27 NOTE — Assessment & Plan Note (Addendum)
We reviewed weight, associated conditions and contributing factors.  Patient would benefit from a comprehensive weight loss plan involving nutrition, behavioral strategies, physical activity and medical interventions.  We will develop a reduced-calorie nutritional approach tailored to their REE and nutritional preference.  The former will be determined in a fasting state at her next visit.  We will also assess for cardiometabolic and biomechanical complications and nutritional derangements via serologies.  Patient expressed interest in pharmacotherapy

## 2022-11-27 NOTE — Assessment & Plan Note (Signed)
Reviewed with her several etiologies.  I suspect she is going through perimenopause and this may be a sign of decreased estrogen and testosterone.  She is also having a difficult time losing weight.  We discussed monitoring sleep, improving hydration.  She has a history of anemia so we will check a CBC and iron studies with her intake labs if she decides to enroll.  Lastly her fatigue may be compounded by the use of SSRI or what is known SSRI fatigue.

## 2022-11-27 NOTE — Progress Notes (Signed)
Office: [phone]  /  Fax: [phone]   Initial Visit  Madison Robinson was seen in clinic today to evaluate for obesity. She is interested in losing weight to improve overall health and reduce the risk of weight related complications. She presents today to review program treatment options, initial physical assessment, and evaluation.     She was referred by: Self-Referral  When asked what else they would like to accomplish? She states: Improve energy levels and physical activity and Improve self-confidence  Weight history: gained weight as got older. Was able to lose weight quickly before. Harder now.   When asked how has your weight affected you? She states: Has affected self-esteem, Having fatigue, and Having poor endurance  Some associated conditions: None  Contributing factors: Family history, Disruption of circadian rhythm, Stress, Eating patterns, and Other: perimenopause  Weight promoting medications identified: Psychotropic medications  Current nutrition plan: None  Current level of physical activity: Other: doing elliptical at home.   Current or previous pharmacotherapy: Phentermine  Response to medication: Lost weight initially but was unable to sustain weight loss   Past medical history includes:   Past Medical History:  Diagnosis Date   Anemia    01/29/2013 BLOOD TRANFUSION   Chicken pox    History of blood transfusion      Objective:   BP 115/65   Pulse 77   Temp 98.2 F (36.8 C)   Ht 5' (1.524 m)   Wt 164 lb (74.4 kg)   SpO2 100%   BMI 32.03 kg/m  She was weighed on the bioimpedance scale: Body mass index is 32.03 kg/m.  Peak Weight:164 , Body Fat%:35, Visceral Fat Rating:8, Weight trend over the last 12 months: Decreasing  General:  Alert, oriented and cooperative. Patient is in no acute distress.  Respiratory: Normal respiratory effort, no problems with respiration noted   Gait: able to ambulate independently  Mental Status: Normal  mood and affect. Normal behavior. Normal judgment and thought content.   DIAGNOSTIC DATA REVIEWED:  BMET    Component Value Date/Time   NA 140 07/29/2022 0734   K 4.0 07/29/2022 0734   CL 105 07/29/2022 0734   CO2 28 07/29/2022 0734   GLUCOSE 82 07/29/2022 0734   BUN 13 07/29/2022 0734   CREATININE 0.61 07/29/2022 0734   CREATININE 0.61 06/19/2020 0921   CALCIUM 9.1 07/29/2022 0734   GFRNONAA 109 06/19/2020 0921   GFRAA 127 06/19/2020 0921   No results found for: "HGBA1C" No results found for: "INSULIN" CBC    Component Value Date/Time   WBC 3.3 (L) 12/10/2017 1141   RBC 4.36 12/10/2017 1141   HGB 12.9 12/10/2017 1141   HCT 37.9 12/10/2017 1141   PLT 204.0 12/10/2017 1141   MCV 87.1 12/10/2017 1141   MCH 25.7 (L) 03/17/2013 1030   MCHC 34.0 12/10/2017 1141   RDW 12.9 12/10/2017 1141   Iron/TIBC/Ferritin/ %Sat No results found for: "IRON", "TIBC", "FERRITIN", "IRONPCTSAT" Lipid Panel     Component Value Date/Time   CHOL 161 07/29/2022 0734   TRIG 78.0 07/29/2022 0734   HDL 46.20 07/29/2022 0734   CHOLHDL 3 07/29/2022 0734   VLDL 15.6 07/29/2022 0734   LDLCALC 100 (H) 07/29/2022 0734   LDLCALC 92 06/19/2020 0921   Hepatic Function Panel     Component Value Date/Time   PROT 7.1 12/10/2017 1141   ALBUMIN 4.1 12/10/2017 1141   AST 82 (H) 12/10/2017 1141   ALT 75 (H) 12/10/2017 1141   ALKPHOS 53  12/10/2017 1141   BILITOT 0.5 12/10/2017 1141      Component Value Date/Time   TSH 0.69 07/29/2022 0734     Assessment and Plan:   Class 1 obesity without serious comorbidity with body mass index (BMI) of 32.0 to 32.9 in adult, unspecified obesity type Assessment & Plan: We reviewed weight, associated conditions and contributing factors.  Patient would benefit from a comprehensive weight loss plan involving nutrition, behavioral strategies, physical activity and medical interventions.  We will develop a reduced-calorie nutritional approach tailored to their REE and  nutritional preference.  The former will be determined in a fasting state at her next visit.  We will also assess for cardiometabolic and biomechanical complications and nutritional derangements via serologies.  Patient expressed interest in pharmacotherapy   Chronic fatigue Assessment & Plan: Reviewed with her several etiologies.  I suspect she is going through perimenopause and this may be a sign of decreased estrogen and testosterone.  She is also having a difficult time losing weight.  We discussed monitoring sleep, improving hydration.  She has a history of anemia so we will check a CBC and iron studies with her intake labs if she decides to enroll.  Lastly her fatigue may be compounded by the use of SSRI or what is known SSRI fatigue.         Obesity Treatment / Action Plan:  Patient will work on garnering support from family and friends to begin weight loss journey. Will work on eliminating or reducing the presence of highly palatable, calorie dense foods in the home. Will complete provided nutritional and psychosocial assessment questionnaire before the next appointment. Will be scheduled for indirect calorimetry to determine resting energy expenditure in a fasting state.  This will allow [place] to create a reduced calorie, high-protein meal plan to promote loss of fat mass while preserving muscle mass. Counseled on the health benefits of losing 5%-15% of total body weight. Will work on improving sleep hygiene and trying to obtain at least 7 hours of sleep. Was counseled on nutritional approaches to weight loss and benefits of complex carbs and high quality protein as part of nutritional weight management. Was counseled on pharmacotherapy and role as an adjunct in weight management.   Obesity Education Performed Today:  She was weighed on the bioimpedance scale and results were discussed and documented in the synopsis.  We discussed obesity as a disease and the importance of a more  detailed evaluation of all the factors contributing to the disease.  We discussed the importance of long term lifestyle changes which include nutrition, exercise and behavioral modifications as well as the importance of customizing this to her specific health and social needs.  We discussed the benefits of reaching a healthier weight to alleviate the symptoms of existing conditions and reduce the risks of the biomechanical, metabolic and psychological effects of obesity.  Mack Hook appears to be in the action stage of change and states they are ready to start intensive lifestyle modifications and behavioral modifications.  30 minutes was spent today on this visit including the above counseling, pre-visit chart review, and post-visit documentation.  Reviewed by clinician on day of visit: allergies, medications, problem list, medical history, surgical history, family history, social history, and previous encounter notes pertinent to obesity diagnosis.   Worthy Rancher, MD

## 2022-12-31 ENCOUNTER — Ambulatory Visit (INDEPENDENT_AMBULATORY_CARE_PROVIDER_SITE_OTHER): Payer: Managed Care, Other (non HMO) | Admitting: Internal Medicine

## 2023-01-07 ENCOUNTER — Ambulatory Visit (INDEPENDENT_AMBULATORY_CARE_PROVIDER_SITE_OTHER): Payer: Managed Care, Other (non HMO) | Admitting: Internal Medicine

## 2023-01-14 ENCOUNTER — Ambulatory Visit (INDEPENDENT_AMBULATORY_CARE_PROVIDER_SITE_OTHER): Payer: Managed Care, Other (non HMO) | Admitting: Internal Medicine

## 2023-01-21 ENCOUNTER — Ambulatory Visit (INDEPENDENT_AMBULATORY_CARE_PROVIDER_SITE_OTHER): Payer: Managed Care, Other (non HMO) | Admitting: Internal Medicine

## 2023-01-21 ENCOUNTER — Encounter (INDEPENDENT_AMBULATORY_CARE_PROVIDER_SITE_OTHER): Payer: Self-pay | Admitting: Internal Medicine

## 2023-01-21 VITALS — BP 107/73 | HR 78 | Temp 97.8°F | Ht 60.0 in | Wt 158.0 lb

## 2023-01-21 DIAGNOSIS — R0602 Shortness of breath: Secondary | ICD-10-CM | POA: Diagnosis not present

## 2023-01-21 DIAGNOSIS — Z1331 Encounter for screening for depression: Secondary | ICD-10-CM

## 2023-01-21 DIAGNOSIS — E669 Obesity, unspecified: Secondary | ICD-10-CM

## 2023-01-21 DIAGNOSIS — Z72821 Inadequate sleep hygiene: Secondary | ICD-10-CM | POA: Diagnosis not present

## 2023-01-21 DIAGNOSIS — E668 Other obesity: Secondary | ICD-10-CM | POA: Diagnosis not present

## 2023-01-21 DIAGNOSIS — R5383 Other fatigue: Secondary | ICD-10-CM

## 2023-01-21 DIAGNOSIS — Z6830 Body mass index (BMI) 30.0-30.9, adult: Secondary | ICD-10-CM | POA: Diagnosis not present

## 2023-01-21 DIAGNOSIS — F32A Depression, unspecified: Secondary | ICD-10-CM

## 2023-01-21 NOTE — Progress Notes (Signed)
Chief Complaint:   OBESITY Madison Robinson (MR# [id number]) is a 47 y.o. female who presents for evaluation and treatment of obesity and related comorbidities. Current BMI is Body mass index is 30.86 kg/m. Madison Robinson has been struggling with her weight for many years and has been unsuccessful in either losing weight, maintaining weight loss, or reaching her healthy weight goal.  Madison Robinson is currently in the action stage of change and ready to dedicate time achieving and maintaining a healthier weight. Madison Robinson is interested in becoming our patient and working on intensive lifestyle modifications including (but not limited to) diet and exercise for weight loss.  Madison Robinson's habits were reviewed today and are as follows: Her family eats meals together, she thinks her family will eat healthier with her, her desired weight loss is 23 lbs, she started gaining weight after 47 years of age, her heaviest weight ever was 160 pounds, she is a picky eater and doesn't like to eat healthier foods, she has significant food cravings issues, she snacks frequently in the evenings, she skips meals frequently, she is frequently drinking liquids with calories, and she frequently makes poor food choices.  Depression Screen Madison Robinson's Food and Mood (modified PHQ-9) score was 14.  Subjective:   1. Other fatigue Madison Robinson admits to daytime somnolence and admits to waking up still tired. Patient has a history of symptoms of daytime fatigue and morning fatigue. Madison Robinson generally gets 4 or 6 hours of sleep per night, and states that she has nightime awakenings. Snoring is not present. Apneic episodes are not present. Epworth Sleepiness Score is 13.   2. SOB (shortness of breath) on exertion Madison Robinson and [place] notes increasing shortness of breath with exercising and seems to be worsening over time with weight gain. She notes getting out of breath sooner with activity than she used to. This has not gotten worse recently.  Madison Robinson denies shortness of breath at rest or orthopnea.  3. Inadequate sleep hygiene Patient has an Epworth of 13 and sleeps about 4 to 6 hours a day nonrestorative.  This may have an effect on her weight.    Assessment/Plan:   1. Other fatigue Madison Robinson does feel that her weight is causing her energy to be lower than it should be. Fatigue may be related to obesity, depression or many other causes. Labs will be ordered, and in the meanwhile, Madison Robinson will focus on self care including making healthy food choices, increasing physical activity and focusing on stress reduction.  - EKG 12-Lead - CBC With Differential - Vitamin B12  2. SOB (shortness of breath) on exertion Madison Robinson does feel that she gets out of breath more easily that she used to when she exercises. Madison Robinson's shortness of breath appears to be obesity related and exercise induced. She has agreed to work on weight loss and gradually increase exercise to treat her exercise induced shortness of breath. Will continue to monitor closely.  3. Inadequate sleep hygiene We will further assess sleep hygiene and OSA at the next office visit.  4. Depression screening Madison Robinson had a positive depression screening. Depression is commonly associated with obesity and often results in emotional eating behaviors. We will monitor this closely and work on CBT to help improve the non-hunger eating patterns. Referral to Psychology may be required if no improvement is seen as she continues in our clinic.  5. Class 1 obesity without serious comorbidity with body mass index (BMI) of 32.0 to 32.9 in adult, unspecified obesity type - CMP14+EGFR - Insulin, random -  Hemoglobin A1c - VITAMIN D 25 Hydroxy (Vit-D Deficiency, Fractures)  Madison Robinson is currently in the action stage of change and her goal is to continue with weight loss efforts. I recommend Madison Robinson begin the structured treatment plan as follows:  She has agreed to the Category 2  Plan.  Exercise goals: No exercise has been prescribed at this time.   Behavioral modification strategies: increasing lean protein intake, decreasing simple carbohydrates, increasing vegetables, increasing water intake, increasing high fiber foods, decreasing eating out, no skipping meals, meal planning and cooking strategies, and better snacking choices.  She was informed of the importance of frequent follow-up visits to maximize her success with intensive lifestyle modifications for her multiple health conditions. She was informed we would discuss her lab results at her next visit unless there is a critical issue that needs to be addressed sooner. Madison Robinson agreed to keep her next visit at the agreed upon time to discuss these results.  Objective:   Blood pressure 107/73, pulse 78, temperature 97.8 F (36.6 C), height 5' (1.524 m), weight 158 lb (71.7 kg), SpO2 97 %. Body mass index is 30.86 kg/m.  EKG: Normal sinus rhythm, rate 68 BPM.  Indirect Calorimeter completed today shows a VO2 of 246 and a REE of 1699.  Her calculated basal metabolic rate is 0981 thus her basal metabolic rate is better than expected.  General: Cooperative, alert, well developed, in no acute distress. HEENT: Conjunctivae and lids unremarkable. Cardiovascular: Regular rhythm.  Lungs: Normal work of breathing. Neurologic: No focal deficits.   Lab Results  Component Value Date   CREATININE 0.61 07/29/2022   BUN 13 07/29/2022   NA 140 07/29/2022   K 4.0 07/29/2022   CL 105 07/29/2022   CO2 28 07/29/2022   Lab Results  Component Value Date   ALT 75 (H) 12/10/2017   AST 82 (H) 12/10/2017   ALKPHOS 53 12/10/2017   BILITOT 0.5 12/10/2017   No results found for: "HGBA1C" No results found for: "INSULIN" Lab Results  Component Value Date   TSH 0.69 07/29/2022   Lab Results  Component Value Date   CHOL 161 07/29/2022   HDL 46.20 07/29/2022   LDLCALC 100 (H) 07/29/2022   TRIG 78.0 07/29/2022   CHOLHDL  3 07/29/2022   Lab Results  Component Value Date   WBC 3.3 (L) 12/10/2017   HGB 12.9 12/10/2017   HCT 37.9 12/10/2017   MCV 87.1 12/10/2017   PLT 204.0 12/10/2017   No results found for: "IRON", "TIBC", "FERRITIN"  Attestation Statements:   Reviewed by clinician on day of visit: allergies, medications, problem list, medical history, surgical history, family history, social history, and previous encounter notes.  Time spent on visit including pre-visit chart review and post-visit charting and care was 40 minutes.   Trude Mcburney, am acting as transcriptionist for Worthy Rancher, MD.  I have reviewed the above documentation for accuracy and completeness, and I agree with the above. -Worthy Rancher, MD

## 2023-01-21 NOTE — Assessment & Plan Note (Signed)
Patient has an Epworth of 13 and sleeps about 4 to 6 hours a day nonrestorative.  This may have an effect on her weight.  We will further assess sleep hygiene and OSA at the next office visit

## 2023-01-22 LAB — CBC WITH DIFFERENTIAL
Basophils Absolute: 0 10*3/uL (ref 0.0–0.2)
Basos: 1 %
EOS (ABSOLUTE): 0.2 10*3/uL (ref 0.0–0.4)
Eos: 4 %
Hematocrit: 38.5 % (ref 34.0–46.6)
Hemoglobin: 12.8 g/dL (ref 11.1–15.9)
Immature Grans (Abs): 0 10*3/uL (ref 0.0–0.1)
Immature Granulocytes: 0 %
Lymphocytes Absolute: 1.9 10*3/uL (ref 0.7–3.1)
Lymphs: 35 %
MCH: 29.4 pg (ref 26.6–33.0)
MCHC: 33.2 g/dL (ref 31.5–35.7)
MCV: 89 fL (ref 79–97)
Monocytes Absolute: 0.4 10*3/uL (ref 0.1–0.9)
Monocytes: 8 %
Neutrophils Absolute: 2.8 10*3/uL (ref 1.4–7.0)
Neutrophils: 52 %
RBC: 4.35 x10E6/uL (ref 3.77–5.28)
RDW: 13.1 % (ref 11.7–15.4)
WBC: 5.3 10*3/uL (ref 3.4–10.8)

## 2023-01-22 LAB — HEMOGLOBIN A1C
Est. average glucose Bld gHb Est-mCnc: 117 mg/dL
Hgb A1c MFr Bld: 5.7 % — ABNORMAL HIGH (ref 4.8–5.6)

## 2023-01-22 LAB — CMP14+EGFR
ALT: 27 IU/L (ref 0–32)
AST: 22 IU/L (ref 0–40)
Albumin: 4.4 g/dL (ref 3.9–4.9)
Alkaline Phosphatase: 86 IU/L (ref 44–121)
BUN/Creatinine Ratio: 18 (ref 9–23)
BUN: 12 mg/dL (ref 6–24)
Bilirubin Total: 0.4 mg/dL (ref 0.0–1.2)
CO2: 25 mmol/L (ref 20–29)
Calcium: 9.6 mg/dL (ref 8.7–10.2)
Chloride: 104 mmol/L (ref 96–106)
Creatinine, Ser: 0.66 mg/dL (ref 0.57–1.00)
Globulin, Total: 3.1 g/dL (ref 1.5–4.5)
Glucose: 81 mg/dL (ref 70–99)
Potassium: 4.5 mmol/L (ref 3.5–5.2)
Sodium: 142 mmol/L (ref 134–144)
Total Protein: 7.5 g/dL (ref 6.0–8.5)
eGFR: 108 mL/min/{1.73_m2} (ref >59)

## 2023-01-22 LAB — VITAMIN B12: Vitamin B-12: 946 pg/mL (ref 232–1245)

## 2023-01-22 LAB — INSULIN, RANDOM: INSULIN: 12.2 u[IU]/mL (ref 2.6–24.9)

## 2023-01-22 LAB — VITAMIN D 25 HYDROXY (VIT D DEFICIENCY, FRACTURES): Vit D, 25-Hydroxy: 32.7 ng/mL (ref 30.0–100.0)

## 2023-02-06 ENCOUNTER — Ambulatory Visit (INDEPENDENT_AMBULATORY_CARE_PROVIDER_SITE_OTHER): Payer: Managed Care, Other (non HMO) | Admitting: Internal Medicine

## 2023-02-06 ENCOUNTER — Encounter (INDEPENDENT_AMBULATORY_CARE_PROVIDER_SITE_OTHER): Payer: Self-pay | Admitting: Internal Medicine

## 2023-02-06 VITALS — BP 98/64 | HR 68 | Temp 97.9°F | Ht 60.0 in | Wt 158.0 lb

## 2023-02-06 DIAGNOSIS — R7303 Prediabetes: Secondary | ICD-10-CM | POA: Diagnosis not present

## 2023-02-06 DIAGNOSIS — Z72821 Inadequate sleep hygiene: Secondary | ICD-10-CM

## 2023-02-06 DIAGNOSIS — E559 Vitamin D deficiency, unspecified: Secondary | ICD-10-CM | POA: Diagnosis not present

## 2023-02-06 DIAGNOSIS — E668 Other obesity: Secondary | ICD-10-CM | POA: Diagnosis not present

## 2023-02-06 DIAGNOSIS — E669 Obesity, unspecified: Secondary | ICD-10-CM

## 2023-02-06 DIAGNOSIS — Z6830 Body mass index (BMI) 30.0-30.9, adult: Secondary | ICD-10-CM

## 2023-02-06 MED ORDER — METFORMIN HCL ER 500 MG PO TB24
500.0000 mg | ORAL_TABLET | Freq: Every day | ORAL | 0 refills | Status: DC
Start: 2023-02-06 — End: 2023-03-10

## 2023-02-06 NOTE — Assessment & Plan Note (Addendum)
Improving.  She had an elevated Epworth, her neck circumference is 14 inches and has a Mallampati of 2.  No history of loud snoring or witnessed apneas.  She will continue to work on sleep hygiene I do not think she has sleep apnea.

## 2023-02-06 NOTE — Assessment & Plan Note (Signed)
Her vitamin D levels are low normal.  I recommend that she increase her vitamin D3 to 2000 international units daily.

## 2023-02-06 NOTE — Progress Notes (Signed)
Office: [phone]  /  Fax: [phone]  WEIGHT SUMMARY AND BIOMETRICS  Vitals Temp: 97.9 F (36.6 C) BP: 98/64 Pulse Rate: 68 SpO2: 100 %   Anthropometric Measurements Height: 5' (1.524 m) Weight: 158 lb (71.7 kg) BMI (Calculated): 30.86 Weight at Last Visit: 158 lb Weight Lost Since Last Visit: 0 Weight Gained Since Last Visit: 0 Starting Weight: 158 lb Total Weight Loss (lbs): 0 lb (0 kg) Peak Weight: 165 lb   Body Composition  Body Fat %: 34.8 % Fat Mass (lbs): 55 lbs Muscle Mass (lbs): 97.8 lbs Total Body Water (lbs): 64 lbs Visceral Fat Rating : 8    No data recorded Today's Visit #: 2  Starting Date: 01/21/23 The 10-year ASCVD risk score (Arnett DK, et al., 2019) is: 0.5%   HPI  Chief Complaint: OBESITY  Tayanna is here to discuss her progress with her obesity treatment plan. She is on the the Category 2 Plan and states she is following her eating plan approximately 60 % of the time.. Not exercising  Interval History:  Since last office visit she has has maintained. She reports working on implementation of reduced calorie nutritional plan She has been working on increasing protein intake at every meal and avoiding and / or reducing liquid calories  Orixegenic Control: Reports problems with appetite and hunger signals.  Denies problems with satiety and satiation.  Reports problems with eating patterns and portion control.  Reports abnormal cravings. Denies feeling deprived or restricted.   Barriers identified: strong hunger signals and appetite, having difficulty preparing healthy meals, having difficulty with meal prep and planning, having difficulty focusing on healthy eating, exposure to enticing environments and/or relationships, work schedule, and moderate to high stress levels.   Pharmacotherapy for weight loss: She is currently taking no anti-obesity medication.    ASSESSMENT AND PLAN  TREATMENT PLAN FOR OBESITY:  Recommended  Dietary Goals  Dequita is currently in the action stage of change. As such, her goal is to continue weight management plan. She has agreed to: continue current plan, she benefits from low-carb high-protein plan.  Difficulties are that she does not like to eat vegetables and was provided with ideas on how to create power bowls, salads during this office visit.  She may also be a candidate for ketogenic diet to initiate the weight loss process.  Behavioral Intervention  We discussed the following Behavioral Modification Strategies today: increasing lean protein intake, decreasing simple carbohydrates , increasing vegetables, increasing lower glycemic fruits, increasing fiber rich foods, increasing water intake, work on meal planning and preparation, identifying sources and decreasing liquid calories, planning for success, and better snacking choices.  Additional resources provided today: Personalized instruction on the use of artificial intelligence for recipes, calorie tracking, and finding healthier options when eating out.   Recommended Physical Activity Goals  Shermika has been advised to work up to 150 minutes of moderate intensity aerobic activity a week and strengthening exercises 2-3 times per week for cardiovascular health, weight loss maintenance and preservation of muscle mass.   She has agreed to :  Think about ways to increase daily physical activity and overcoming barriers to exercise  Pharmacotherapy We discussed various medication options to help [name] with her weight loss efforts and we both agreed to :  Start metformin 500 mg XR with primary indication of prediabetes for incretin effect.  ASSOCIATED CONDITIONS ADDRESSED TODAY  Inadequate sleep hygiene Assessment & Plan: Improving.  She had an elevated Epworth, her neck circumference is 14 inches  and has a Mallampati of 2.  No history of loud snoring or witnessed apneas.  She will continue to work on sleep hygiene I do not  think she has sleep apnea.   Class 1 obesity with serious comorbidity and body mass index (BMI) of 30.0 to 30.9 in adult, unspecified obesity type  Prediabetes Assessment & Plan: Most recent A1c is  Lab Results  Component Value Date   HGBA1C 5.7 (H) 01/21/2023   She has elevated insulin levels.  We discussed the health risks associated with this.  Patient aware of disease state and risk of progression. This may contribute to abnormal cravings, fatigue and diabetic complications without having diabetes.  She has acanthosis nigricans on physical exam.  We reviewed treatment options which includes losing 7 to 10% of body weight, increasing physical activity to a goal of 150 minutes a week at moderate intensity.  After discussion of benefits and side effects she is agreeable to starting metformin XR 500 mg 1 tablet daily for pharmacoprophylaxis.  I reviewed blood pressure and cholesterol parameters they are within acceptable ranges.  The 10-year ASCVD risk score (Arnett DK, et al., 2019) is: 0.5%    Orders: -     metFORMIN HCl ER; Take 1 tablet (500 mg total) by mouth daily with breakfast.  Dispense: 30 tablet; Refill: 0  Vitamin D insufficiency Assessment & Plan: Her vitamin D levels are low normal.  I recommend that she increase her vitamin D3 to 2000 international units daily.     PHYSICAL EXAM:  Blood pressure 98/64, pulse 68, temperature 97.9 F (36.6 C), height 5' (1.524 m), weight 158 lb (71.7 kg), SpO2 100%. Body mass index is 30.86 kg/m.  General: She is overweight, cooperative, alert, well developed, and in no acute distress. PSYCH: Has normal mood, affect and thought process.   HEENT: EOMI, sclerae are anicteric. Lungs: Normal breathing effort, no conversational dyspnea. Extremities: No edema.  Neurologic: No gross sensory or motor deficits. No tremors or fasciculations noted.    DIAGNOSTIC DATA REVIEWED:  BMET    Component Value Date/Time   NA 142  01/21/2023 0830   K 4.5 01/21/2023 0830   CL 104 01/21/2023 0830   CO2 25 01/21/2023 0830   GLUCOSE 81 01/21/2023 0830   GLUCOSE 82 07/29/2022 0734   BUN 12 01/21/2023 0830   CREATININE 0.66 01/21/2023 0830   CREATININE 0.61 06/19/2020 0921   CALCIUM 9.6 01/21/2023 0830   GFRNONAA 109 06/19/2020 0921   GFRAA 127 06/19/2020 0921   Lab Results  Component Value Date   HGBA1C 5.7 (H) 01/21/2023   Lab Results  Component Value Date   INSULIN 12.2 01/21/2023   Lab Results  Component Value Date   TSH 0.69 07/29/2022   CBC    Component Value Date/Time   WBC 5.3 01/21/2023 0830   WBC 3.3 (L) 12/10/2017 1141   RBC 4.35 01/21/2023 0830   RBC 4.36 12/10/2017 1141   HGB 12.8 01/21/2023 0830   HCT 38.5 01/21/2023 0830   PLT 204.0 12/10/2017 1141   MCV 89 01/21/2023 0830   MCH 29.4 01/21/2023 0830   MCH 25.7 (L) 03/17/2013 1030   MCHC 33.2 01/21/2023 0830   MCHC 34.0 12/10/2017 1141   RDW 13.1 01/21/2023 0830   Iron Studies No results found for: "IRON", "TIBC", "FERRITIN", "IRONPCTSAT" Lipid Panel     Component Value Date/Time   CHOL 161 07/29/2022 0734   TRIG 78.0 07/29/2022 0734   HDL 46.20 07/29/2022 0734  CHOLHDL 3 07/29/2022 0734   VLDL 15.6 07/29/2022 0734   LDLCALC 100 (H) 07/29/2022 0734   LDLCALC 92 06/19/2020 0921   Hepatic Function Panel     Component Value Date/Time   PROT 7.5 01/21/2023 0830   ALBUMIN 4.4 01/21/2023 0830   AST 22 01/21/2023 0830   ALT 27 01/21/2023 0830   ALKPHOS 86 01/21/2023 0830   BILITOT 0.4 01/21/2023 0830      Component Value Date/Time   TSH 0.69 07/29/2022 0734   Nutritional Lab Results  Component Value Date   VD25OH 32.7 01/21/2023     Return in about 3 weeks (around 02/27/2023) for For Weight Mangement with Dr. Rikki Spearing.Marland Kitchen She was informed of the importance of frequent follow up visits to maximize her success with intensive lifestyle modifications for her multiple health conditions.   ATTESTASTION  STATEMENTS:  Reviewed by clinician on day of visit: allergies, medications, problem list, medical history, surgical history, family history, social history, and previous encounter notes.     Worthy Rancher, MD

## 2023-02-06 NOTE — Assessment & Plan Note (Addendum)
Most recent A1c is  Lab Results  Component Value Date   HGBA1C 5.7 (H) 01/21/2023   She has elevated insulin levels.  We discussed the health risks associated with this.  Patient aware of disease state and risk of progression. This may contribute to abnormal cravings, fatigue and diabetic complications without having diabetes.  She has acanthosis nigricans on physical exam.  We reviewed treatment options which includes losing 7 to 10% of body weight, increasing physical activity to a goal of 150 minutes a week at moderate intensity.  After discussion of benefits and side effects she is agreeable to starting metformin XR 500 mg 1 tablet daily for pharmacoprophylaxis.  I reviewed blood pressure and cholesterol parameters they are within acceptable ranges.  The 10-year ASCVD risk score (Arnett DK, et al., 2019) is: 0.5%

## 2023-02-28 ENCOUNTER — Other Ambulatory Visit (INDEPENDENT_AMBULATORY_CARE_PROVIDER_SITE_OTHER): Payer: Self-pay | Admitting: Internal Medicine

## 2023-02-28 DIAGNOSIS — R7303 Prediabetes: Secondary | ICD-10-CM

## 2023-03-06 ENCOUNTER — Encounter (INDEPENDENT_AMBULATORY_CARE_PROVIDER_SITE_OTHER): Payer: Self-pay

## 2023-03-06 ENCOUNTER — Ambulatory Visit (INDEPENDENT_AMBULATORY_CARE_PROVIDER_SITE_OTHER): Payer: Managed Care, Other (non HMO) | Admitting: Internal Medicine

## 2023-03-10 ENCOUNTER — Encounter (INDEPENDENT_AMBULATORY_CARE_PROVIDER_SITE_OTHER): Payer: Self-pay | Admitting: Family Medicine

## 2023-03-10 ENCOUNTER — Ambulatory Visit (INDEPENDENT_AMBULATORY_CARE_PROVIDER_SITE_OTHER): Payer: Managed Care, Other (non HMO) | Admitting: Family Medicine

## 2023-03-10 ENCOUNTER — Telehealth (INDEPENDENT_AMBULATORY_CARE_PROVIDER_SITE_OTHER): Payer: Self-pay

## 2023-03-10 VITALS — HR 76 | Temp 98.3°F | Ht 60.0 in | Wt 157.0 lb

## 2023-03-10 DIAGNOSIS — E559 Vitamin D deficiency, unspecified: Secondary | ICD-10-CM

## 2023-03-10 DIAGNOSIS — E669 Obesity, unspecified: Secondary | ICD-10-CM

## 2023-03-10 DIAGNOSIS — Z6830 Body mass index (BMI) 30.0-30.9, adult: Secondary | ICD-10-CM | POA: Diagnosis not present

## 2023-03-10 DIAGNOSIS — E668 Other obesity: Secondary | ICD-10-CM | POA: Diagnosis not present

## 2023-03-10 DIAGNOSIS — R7303 Prediabetes: Secondary | ICD-10-CM

## 2023-03-10 MED ORDER — METFORMIN HCL ER 500 MG PO TB24: 500.0000 mg | ORAL_TABLET | Freq: Two times a day (BID) | ORAL | 0 refills | Status: DC

## 2023-03-10 MED ORDER — VITAMIN D3 50 MCG (2000 UT) PO CAPS
2000.0000 [IU] | ORAL_CAPSULE | Freq: Every day | ORAL | Status: AC
Start: 2023-03-10 — End: ?

## 2023-03-10 MED ORDER — METFORMIN HCL ER 500 MG PO TB24
500.0000 mg | ORAL_TABLET | Freq: Two times a day (BID) | ORAL | 0 refills | Status: DC
Start: 2023-03-10 — End: 2023-03-10

## 2023-03-10 MED ORDER — METFORMIN HCL ER 500 MG PO TB24
500.0000 mg | ORAL_TABLET | Freq: Two times a day (BID) | ORAL | 0 refills | Status: DC
Start: 2023-03-10 — End: 2023-05-07

## 2023-03-10 NOTE — Progress Notes (Signed)
Carlye Grippe, D.O.  ABFM, ABOM Specializing in Clinical Bariatric Medicine  Office located at: [address] [place], [place]  [address]     Assessment and Plan:   Medications Discontinued During This Encounter  Medication Reason   metFORMIN (GLUCOPHAGE-XR) 500 MG 24 hr tablet Reorder   metFORMIN (GLUCOPHAGE-XR) 500 MG 24 hr tablet    metFORMIN (GLUCOPHAGE-XR) 500 MG 24 hr tablet     Meds ordered this encounter  Medications   DISCONTD: metFORMIN (GLUCOPHAGE-XR) 500 MG 24 hr tablet    Sig: Take 1 tablet (500 mg total) by mouth 2 (two) times daily with a meal.    Dispense:  60 tablet    Refill:  0   Cholecalciferol (VITAMIN D3) 50 MCG (2000 UT) capsule    Sig: Take 1 capsule (2,000 Units total) by mouth daily.   DISCONTD: metFORMIN (GLUCOPHAGE-XR) 500 MG 24 hr tablet    Sig: Take 1 tablet (500 mg total) by mouth 2 (two) times daily with a meal.    Dispense:  60 tablet    Refill:  0   metFORMIN (GLUCOPHAGE-XR) 500 MG 24 hr tablet    Sig: Take 1 tablet (500 mg total) by mouth 2 (two) times daily with a meal.    Dispense:  60 tablet    Refill:  0    Vitamin D insufficiency Assessment: Condition is Not at goal.. Her vitamin D levels are below the recommended range. She continues oral OTC vitamin D3 2000lU daily.  Lab Results  Component Value Date   VD25OH 32.7 01/21/2023   Plan: - Continue OTC vitamin D3 2000lU daily. I will refill today.   - weight loss will likely improve availability of vitamin D, thus encouraged Nikiesha to continue with meal plan and their weight loss efforts to further improve this condition.  Thus, we will need to monitor levels regularly (every 3-4 mo on average) to keep levels within normal limits and prevent over supplementation.   Prediabetes Assessment: Condition is Not at goal.. Pt has been compliant and tolerant of Metformin once daily. She endorses hunger and cravings towards the end of the day as she feels as the Metformin wears off  throughout the day. She denies any GI upset, N/V/D or any adverse side effects.  Lab Results  Component Value Date   HGBA1C 5.7 (H) 01/21/2023   INSULIN 12.2 01/21/2023     Plan: - Continue Metformin XR 500mg  twice daily instead of once to help improve her hunger cravings.  I will refill today.   - Stressed importance of dietary, lifestyle modifications, and to not skip meals throughout the day.   - Continue to decrease simple carbs/ sugars; increase fiber and proteins -> follow her meal plan and I explained role of simple carbs and insulin levels on hunger and cravings.  - Anticipatory guidance given.    - Joscelyne will continue to work on weight loss, exercise, via their meal plan we devised to help decrease the risk of progressing to diabetes.   - We will recheck A1c and fasting insulin level in approximately 3 months from last check, or as deemed appropriate.    TREATMENT PLAN FOR OBESITY: BMI 30.0-30.9,adult- current BMI 30.66 Class 1 obesity without serious comorbidity with body mass index (BMI) of 32.0 to 32.9 in adult, unspecified obesity type Assessment:  Adama Corea is here to discuss her progress with her obesity treatment plan along with follow-up of her obesity related diagnoses. See Medical Weight Management Flowsheet  for complete bioelectrical impedance results.  Condition is not optimized. Biometric data collected today, was reviewed with patient.   Since last office visit on 02/06/2023 patient's  Muscle mass has decreased by 1.8lb. Fat mass has increased by 0.8lb. Total body water is 64lb.  Counseling done on how various foods will affect these numbers and how to maximize success  Total lbs lost to date: 1lbs Total weight loss percentage to date: 0.63%   Plan: - Continue to adhere to the Category 2 nutritional plan.   - I advised her to switch her lunch and dinner around or move 4oz of lean meat to protein to allow her to eat everything on the meal plan.    - Snack on health snacks.   - I recommended Keto brownies and cookies as her sweet treats.   Behavioral Intervention Additional resources provided today: patient declined Evidence-based interventions for health behavior change were utilized today including the discussion of self monitoring techniques, problem-solving barriers and SMART goal setting techniques.   Regarding patient's less desirable eating habits and patterns, we employed the technique of small changes.  Pt will specifically work on: Not skipping dinner for next visit.     She has agreed to [employer] strengthening exercises with a goal of 2-3 sessions a week  and Start aerobic activity with a goal of 150 minutes a week at moderate intensity.    FOLLOW UP: Return in about 3 weeks (around 03/31/2023).  She was informed of the importance of frequent follow up visits to maximize her success with intensive lifestyle modifications for her multiple health conditions.  Subjective:   Chief complaint: Obesity Mykah is here to discuss her progress with her obesity treatment plan. She is on the the Category 2 Plan and states she is following her eating plan approximately 60-70% of the time. She states she is exercising walking 30 minutes 2 days per week. W 2 day  Interval History:  Jameice Sarsour is here for a follow up office visit.   She is usually seen by Dr. Rikki Spearing.   Since last OV she has been doing well. She has improved on eating smaller amounts of sweets/carbs. She usually skips breakfast during the week but now eats breakfast about 3-4 days out the week. She states that when eating her lunch later in the day it makes her fuller for dinner and she will then skip dinner.    Pharmacotherapy for weight loss: She is currently taking  Metformin  for medical weight loss.  Denies side effects.    Review of Systems:  Pertinent positives were addressed with patient today.  Reviewed by clinician on day of visit:  allergies, medications, problem list, medical history, surgical history, family history, social history, and previous encounter notes.  Weight Summary and Biometrics   Weight Lost Since Last Visit: 1lb  Weight Gained Since Last Visit: 0lb   Vitals Temp: 98.3 F (36.8 C) BP: 112/76 Pulse Rate: 76 SpO2: 100 %   Anthropometric Measurements Height: 5' (1.524 m) Weight: 157 lb (71.2 kg) BMI (Calculated): 30.66 Weight at Last Visit: 158 lb Weight Lost Since Last Visit: 1lb Weight Gained Since Last Visit: 0lb Starting Weight: 158 lb Total Weight Loss (lbs): 1 lb (0.454 kg) Peak Weight: 165 lb   Body Composition  Body Fat %: 35.5 % Fat Mass (lbs): 55.8 lbs Muscle Mass (lbs): 96 lbs Total Body Water (lbs): 64 lbs Visceral Fat Rating : 8   Other Clinical Data Fasting: no Labs: no  Today's Visit #: 3 Starting Date: 01/21/23     Objective:   PHYSICAL EXAM: Blood pressure 112/76, pulse 76, temperature 98.3 F (36.8 C), height 5' (1.524 m), weight 157 lb (71.2 kg), SpO2 100%. Body mass index is 30.66 kg/m.  General: Well Developed, well nourished, and in no acute distress.  HEENT: Normocephalic, atraumatic Skin: Warm and dry, cap RF less 2 sec, good turgor Chest:  Normal excursion, shape, no gross abn Respiratory: speaking in full sentences, no conversational dyspnea NeuroM-Sk: Ambulates w/o assistance, moves * 4 Psych: A and O *3, insight good, mood-full  DIAGNOSTIC DATA REVIEWED:  BMET    Component Value Date/Time   NA 142 01/21/2023 0830   K 4.5 01/21/2023 0830   CL 104 01/21/2023 0830   CO2 25 01/21/2023 0830   GLUCOSE 81 01/21/2023 0830   GLUCOSE 82 07/29/2022 0734   BUN 12 01/21/2023 0830   CREATININE 0.66 01/21/2023 0830   CREATININE 0.61 06/19/2020 0921   CALCIUM 9.6 01/21/2023 0830   GFRNONAA 109 06/19/2020 0921   GFRAA 127 06/19/2020 0921   Lab Results  Component Value Date   HGBA1C 5.7 (H) 01/21/2023   Lab Results  Component Value Date    INSULIN 12.2 01/21/2023   Lab Results  Component Value Date   TSH 0.69 07/29/2022   CBC    Component Value Date/Time   WBC 5.3 01/21/2023 0830   WBC 3.3 (L) 12/10/2017 1141   RBC 4.35 01/21/2023 0830   RBC 4.36 12/10/2017 1141   HGB 12.8 01/21/2023 0830   HCT 38.5 01/21/2023 0830   PLT 204.0 12/10/2017 1141   MCV 89 01/21/2023 0830   MCH 29.4 01/21/2023 0830   MCH 25.7 (L) 03/17/2013 1030   MCHC 33.2 01/21/2023 0830   MCHC 34.0 12/10/2017 1141   RDW 13.1 01/21/2023 0830   Iron Studies No results found for: "IRON", "TIBC", "FERRITIN", "IRONPCTSAT" Lipid Panel     Component Value Date/Time   CHOL 161 07/29/2022 0734   TRIG 78.0 07/29/2022 0734   HDL 46.20 07/29/2022 0734   CHOLHDL 3 07/29/2022 0734   VLDL 15.6 07/29/2022 0734   LDLCALC 100 (H) 07/29/2022 0734   LDLCALC 92 06/19/2020 0921   Hepatic Function Panel     Component Value Date/Time   PROT 7.5 01/21/2023 0830   ALBUMIN 4.4 01/21/2023 0830   AST 22 01/21/2023 0830   ALT 27 01/21/2023 0830   ALKPHOS 86 01/21/2023 0830   BILITOT 0.4 01/21/2023 0830      Component Value Date/Time   TSH 0.69 07/29/2022 0734   Nutritional Lab Results  Component Value Date   VD25OH 32.7 01/21/2023    Attestations:   I, Clinical biochemist, acting as a Stage manager for [employer], DO., have compiled all relevant documentation for today's office visit on behalf of Thomasene Lot, DO, while in the presence of [employer], DO.  I have reviewed the above documentation for accuracy and completeness, and I agree with the above. Carlye Grippe, D.O.  The 21st Century Cures Act was signed into law in 2016 which includes the topic of electronic health records.  This provides immediate access to information in MyChart.  This includes consultation notes, operative notes, office notes, lab results and pathology reports.  If you have any questions about what you read please let us know at your next visit so we can  discuss your concerns and take corrective action if need be.  We are right here with you.

## 2023-03-10 NOTE — Telephone Encounter (Signed)
Metformin refill called into [id number]

## 2023-04-21 ENCOUNTER — Ambulatory Visit (INDEPENDENT_AMBULATORY_CARE_PROVIDER_SITE_OTHER): Payer: Managed Care, Other (non HMO) | Admitting: Internal Medicine

## 2023-05-07 ENCOUNTER — Other Ambulatory Visit (INDEPENDENT_AMBULATORY_CARE_PROVIDER_SITE_OTHER): Payer: Self-pay | Admitting: Internal Medicine

## 2023-05-07 ENCOUNTER — Encounter (INDEPENDENT_AMBULATORY_CARE_PROVIDER_SITE_OTHER): Payer: Self-pay | Admitting: Internal Medicine

## 2023-05-07 ENCOUNTER — Ambulatory Visit (INDEPENDENT_AMBULATORY_CARE_PROVIDER_SITE_OTHER): Payer: Managed Care, Other (non HMO) | Admitting: Internal Medicine

## 2023-05-07 VITALS — BP 123/80 | HR 74 | Temp 98.3°F | Ht 60.0 in | Wt 154.0 lb

## 2023-05-07 DIAGNOSIS — Z72821 Inadequate sleep hygiene: Secondary | ICD-10-CM

## 2023-05-07 DIAGNOSIS — R7303 Prediabetes: Secondary | ICD-10-CM | POA: Diagnosis not present

## 2023-05-07 DIAGNOSIS — Z6830 Body mass index (BMI) 30.0-30.9, adult: Secondary | ICD-10-CM | POA: Diagnosis not present

## 2023-05-07 DIAGNOSIS — E66811 Obesity, class 1: Secondary | ICD-10-CM | POA: Diagnosis not present

## 2023-05-07 DIAGNOSIS — E559 Vitamin D deficiency, unspecified: Secondary | ICD-10-CM

## 2023-05-07 MED ORDER — ZEPBOUND 2.5 MG/0.5ML ~~LOC~~ SOAJ
2.5000 mg | SUBCUTANEOUS | 0 refills | Status: DC
Start: 2023-05-07 — End: 2023-05-28

## 2023-05-07 MED ORDER — METFORMIN HCL ER 500 MG PO TB24: 500.0000 mg | ORAL_TABLET | Freq: Two times a day (BID) | ORAL | 0 refills | Status: DC

## 2023-05-07 NOTE — Progress Notes (Unsigned)
Office: [phone]  /  Fax: [phone]  WEIGHT SUMMARY AND BIOMETRICS  Vitals Temp: 98.3 F (36.8 C) BP: 123/80 Pulse Rate: 74 SpO2: 99 %   Anthropometric Measurements Height: 5' (1.524 m) Weight: 154 lb (69.9 kg) BMI (Calculated): 30.08 Weight at Last Visit: 157 lb Weight Lost Since Last Visit: 3 lb Weight Gained Since Last Visit: 0 Starting Weight: 158 lb Total Weight Loss (lbs): 4 lb (1.814 kg) Peak Weight: 165 lb   Body Composition  Body Fat %: 34 % Fat Mass (lbs): 52.6 lbs Muscle Mass (lbs): 96.8 lbs Total Body Water (lbs): 63.6 lbs Visceral Fat Rating : 7    No data recorded Today's Visit #: 4  Starting Date: 01/21/23   HPI  Chief Complaint: OBESITY  Madison Robinson is here to discuss her progress with her obesity treatment plan. She is on the the Category 2 Plan and states she is following her eating plan approximately 75 % of the time. She states she is exercising 30 minutes 3 times per week.  Interval History:  Since last office visit she has {emweight change:30888}. She reports {EMADHERENCE:28838::"good adherence to reduced calorie nutritional plan."} She has been working on Madison Robinson labels","not skipping meals","increasing protein intake at every meal","drinking more water","making healthier choices","reducing portion sizes","incorporating more whole foods"}  Orexigenic Control: {ACTIONS;DENIES/REPORTS:21021675::"Denies"} problems with appetite and hunger signals.  {ACTIONS;DENIES/REPORTS:21021675::"Denies"} problems with satiety and satiation.  {ACTIONS;DENIES/REPORTS:21021675::"Denies"} problems with eating patterns and portion control.  {ACTIONS;DENIES/REPORTS:21021675::"Denies"} abnormal cravings. {ACTIONS;DENIES/REPORTS:21021675::"Denies"} feeling deprived or restricted.   Barriers identified: {EMOBESITYBARRIERS:28841::"none"}.   Pharmacotherapy for weight loss: She is currently taking {EMPharmaco:28845}.     ASSESSMENT AND PLAN  TREATMENT PLAN FOR OBESITY:  Recommended Dietary Goals  Madison Robinson is currently in the action stage of change. As such, her goal is to continue weight management plan. She has agreed to: {EMWTLOSSPLAN:29297::"continue current plan"}  Behavioral Intervention  We discussed the following Behavioral Modification Strategies today: {EMWMwtlossstrategies:28914::"continue to work on maintaining a reduced calorie state, getting the recommended amount of protein, incorporating whole foods, making healthy choices, staying well hydrated and practicing mindfulness when eating."}.  Additional resources provided today: {EMadditionalresources:29169::"None"}  Recommended Physical Activity Goals  Madison Robinson has been advised to work up to 150 minutes of moderate intensity aerobic activity a week and strengthening exercises 2-3 times per week for cardiovascular health, weight loss maintenance and preservation of muscle mass.   She has agreed to :  {EMEXERCISE:28847::"Think about enjoyable ways to increase daily physical activity and overcoming barriers to exercise","Increase physical activity in their day and reduce sedentary time (increase NEAT)."}  Pharmacotherapy We discussed various medication options to help Madison Robinson with her weight loss efforts and we both agreed to : {EMagreedrx:29170::"continue with nutritional and behavioral strategies"}  ASSOCIATED CONDITIONS ADDRESSED TODAY  Class 1 obesity with serious comorbidity and body mass index (BMI) of 30.0 to 30.9 in adult, unspecified obesity type -     Zepbound; Inject 2.5 mg into the skin once a week.  Dispense: 2 mL; Refill: 0  Prediabetes -     metFORMIN HCl ER; Take 1 tablet (500 mg total) by mouth 2 (two) times daily with a meal.  Dispense: 60 tablet; Refill: 0  Vitamin D insufficiency  Inadequate sleep hygiene    PHYSICAL EXAM:  Blood pressure 123/80, pulse 74, temperature 98.3 F (36.8 C), height 5' (1.524 m),  weight 154 lb (69.9 kg), SpO2 99%. Body mass index is 30.08 kg/m.  General: She is overweight, cooperative, alert, well developed, and in no acute distress. PSYCH: Has  normal mood, affect and thought process.   HEENT: EOMI, sclerae are anicteric. Lungs: Normal breathing effort, no conversational dyspnea. Extremities: No edema.  Neurologic: No gross sensory or motor deficits. No tremors or fasciculations noted.    DIAGNOSTIC DATA REVIEWED:  BMET    Component Value Date/Time   NA 142 01/21/2023 0830   K 4.5 01/21/2023 0830   CL 104 01/21/2023 0830   CO2 25 01/21/2023 0830   GLUCOSE 81 01/21/2023 0830   GLUCOSE 82 07/29/2022 0734   BUN 12 01/21/2023 0830   CREATININE 0.66 01/21/2023 0830   CREATININE 0.61 06/19/2020 0921   CALCIUM 9.6 01/21/2023 0830   GFRNONAA 109 06/19/2020 0921   GFRAA 127 06/19/2020 0921   Lab Results  Component Value Date   HGBA1C 5.7 (H) 01/21/2023   Lab Results  Component Value Date   INSULIN 12.2 01/21/2023   Lab Results  Component Value Date   TSH 0.69 07/29/2022   CBC    Component Value Date/Time   WBC 5.3 01/21/2023 0830   WBC 3.3 (L) 12/10/2017 1141   RBC 4.35 01/21/2023 0830   RBC 4.36 12/10/2017 1141   HGB 12.8 01/21/2023 0830   HCT 38.5 01/21/2023 0830   PLT 204.0 12/10/2017 1141   MCV 89 01/21/2023 0830   MCH 29.4 01/21/2023 0830   MCH 25.7 (L) 03/17/2013 1030   MCHC 33.2 01/21/2023 0830   MCHC 34.0 12/10/2017 1141   RDW 13.1 01/21/2023 0830   Iron Studies No results found for: "IRON", "TIBC", "FERRITIN", "IRONPCTSAT" Lipid Panel     Component Value Date/Time   CHOL 161 07/29/2022 0734   TRIG 78.0 07/29/2022 0734   HDL 46.20 07/29/2022 0734   CHOLHDL 3 07/29/2022 0734   VLDL 15.6 07/29/2022 0734   LDLCALC 100 (H) 07/29/2022 0734   LDLCALC 92 06/19/2020 0921   Hepatic Function Panel     Component Value Date/Time   PROT 7.5 01/21/2023 0830   ALBUMIN 4.4 01/21/2023 0830   AST 22 01/21/2023 0830   ALT 27 01/21/2023  0830   ALKPHOS 86 01/21/2023 0830   BILITOT 0.4 01/21/2023 0830      Component Value Date/Time   TSH 0.69 07/29/2022 0734   Nutritional Lab Results  Component Value Date   VD25OH 32.7 01/21/2023     Return in about 3 weeks (around 05/28/2023) for For Weight Mangement with Dr. Rikki Spearing.Madison Kitchen She was informed of the importance of frequent follow up visits to maximize her success with intensive lifestyle modifications for her multiple health conditions.   ATTESTASTION STATEMENTS:  Reviewed by clinician on day of visit: allergies, medications, problem list, medical history, surgical history, family history, social history, and previous encounter notes.     Worthy Rancher, MD

## 2023-05-08 ENCOUNTER — Encounter (INDEPENDENT_AMBULATORY_CARE_PROVIDER_SITE_OTHER): Payer: Self-pay

## 2023-05-08 NOTE — Assessment & Plan Note (Signed)
On metformin twice daily without any adverse effects.  She benefits from incretin therapy for diabetes prevention and also weight management.  After discussion of benefits and side effect she will be started on Zepbound 2.5 mg once a week.  She will continue on metformin.

## 2023-05-08 NOTE — Assessment & Plan Note (Signed)
 See obesity treatment plan

## 2023-05-08 NOTE — Assessment & Plan Note (Signed)
Improved.  Continue with sleep hygiene and lifestyle changes.

## 2023-05-08 NOTE — Telephone Encounter (Signed)
PA started 8:05 05/08/23

## 2023-05-28 ENCOUNTER — Ambulatory Visit (INDEPENDENT_AMBULATORY_CARE_PROVIDER_SITE_OTHER): Payer: Managed Care, Other (non HMO) | Admitting: Internal Medicine

## 2023-05-28 ENCOUNTER — Encounter (INDEPENDENT_AMBULATORY_CARE_PROVIDER_SITE_OTHER): Payer: Self-pay | Admitting: Internal Medicine

## 2023-05-28 DIAGNOSIS — E66811 Obesity, class 1: Secondary | ICD-10-CM | POA: Diagnosis not present

## 2023-05-28 DIAGNOSIS — R7303 Prediabetes: Secondary | ICD-10-CM

## 2023-05-28 DIAGNOSIS — Z6830 Body mass index (BMI) 30.0-30.9, adult: Secondary | ICD-10-CM

## 2023-05-28 MED ORDER — METFORMIN HCL ER 500 MG PO TB24: 500.0000 mg | ORAL_TABLET | Freq: Two times a day (BID) | ORAL | 0 refills | Status: DC

## 2023-05-28 MED ORDER — PHENTERMINE HCL 37.5 MG PO TABS: 18.7500 mg | ORAL_TABLET | Freq: Every day | ORAL | 0 refills | Status: DC

## 2023-05-28 NOTE — Progress Notes (Signed)
Office: [phone]  /  Fax: [phone]  WEIGHT SUMMARY AND BIOMETRICS  Vitals Temp: 98.1 F (36.7 C) BP: 116/80 Pulse Rate: 88 SpO2: 99 %   Anthropometric Measurements Height: 5' (1.524 m) Weight: 155 lb (70.3 kg) BMI (Calculated): 30.27 Weight at Last Visit: 154 lb Weight Lost Since Last Visit: 1 lb Weight Gained Since Last Visit: 0 lb Starting Weight: 157 lb Total Weight Loss (lbs): 3 lb (1.361 kg) Peak Weight: 165 lb   Body Composition  Body Fat %: 35 % Fat Mass (lbs): 54.4 lbs Muscle Mass (lbs): 95.6 lbs Total Body Water (lbs): 65 lbs Visceral Fat Rating : 8    No data recorded Today's Visit #: 4  Starting Date: 01/21/23   HPI  Chief Complaint: OBESITY  Madison Robinson is here to discuss her progress with her obesity treatment plan. She is on the the Category 2 Plan and states she is following her eating plan approximately 60-70 % of the time. She states she is exercising 30 minutes 30 times per week.  Interval History:  Since last office visit she has gained 1 pounds. She reports fair adherence to reduced calorie nutritional plan. She has been working on not skipping meals, making healthier choices, reducing portion sizes, and incorporating more whole foods  Orexigenic Control: Reports problems with appetite and hunger signals.  Reports problems with satiety and satiation.  Denies problems with eating patterns and portion control.  Denies abnormal cravings. Denies feeling deprived or restricted.   Barriers identified: cost of medication and strong hunger signals and impaired satiety / inhibitory control.   Pharmacotherapy for weight loss: She is currently taking Metformin (off label use for incretin effect and / or insulin resistance and / or diabetes prevention) with adequate clinical response  and without side effects..    ASSESSMENT AND PLAN  TREATMENT PLAN FOR OBESITY:  Recommended Dietary Goals  Soriya is currently in the action stage  of change. As such, her goal is to continue weight management plan. She has agreed to: continue current plan  Behavioral Intervention  We discussed the following Behavioral Modification Strategies today: increasing lean protein intake to established goals, decreasing simple carbohydrates , increasing vegetables, increasing lower glycemic fruits, increasing fiber rich foods, and avoiding skipping meals.  Additional resources provided today: None  Recommended Physical Activity Goals  Kiaja has been advised to work up to 150 minutes of moderate intensity aerobic activity a week and strengthening exercises 2-3 times per week for cardiovascular health, weight loss maintenance and preservation of muscle mass.   She has agreed to :  Think about enjoyable ways to increase daily physical activity and overcoming barriers to exercise and Increase physical activity in their day and reduce sedentary time (increase NEAT).  Pharmacotherapy We discussed various medication options to help [name] with her weight loss efforts and we both agreed to : start anti-obesity medication.  In addition to reduced calorie nutrition plan (RCNP), behavioral strategies and physical activity, Laveda would benefit from pharmacotherapy to assist with hunger signals, satiety and cravings. This will reduce obesity-related health risks by inducing weight loss, and help reduce food consumption and adherence to [facility]) . It may also improve QOL by improving self-confidence and reduce the  setbacks associated with metabolic adaptations.  Patient does not have coverage for GLP-1 therapy.  After discussion of treatment options, mechanisms of action, benefits, side effects, contraindications and shared decision making she is agreeable to starting phentermine 18.75 mg once a week.  ASSOCIATED CONDITIONS ADDRESSED TODAY  Class 1 obesity with serious comorbidity and body mass index (BMI) of 30.0 to 30.9 in adult, unspecified  obesity type Assessment & Plan: We reviewed indirect calorimetry results in comparison to calculated there might be an overestimation of caloric requirements this may explain slow rate of weight loss.  Based on 24-hour recall I do not think she is exceeding 1200 cal but she is also not getting an adequate amount of protein.  We reviewed ways to increase protein intake.  Patient also benefits from antiobesity medication.  She will be started on phentermine.  We will assess clinical response in 3 to 4 weeks.  Orders: -     Phentermine HCl; Take 0.5 tablets (18.75 mg total) by mouth daily before breakfast.  Dispense: 15 tablet; Refill: 0  Prediabetes Assessment & Plan: On metformin twice daily without any adverse effects.  She benefits from incretin therapy for diabetes prevention and also weight management.  Her insurance does not cover GLP-1 therapy.  She will continue on metformin for pharmacoprophylaxis along with nutritional and behavioral strategies for weight loss.  Orders: -     metFORMIN HCl ER; Take 1 tablet (500 mg total) by mouth 2 (two) times daily with a meal.  Dispense: 60 tablet; Refill: 0    PHYSICAL EXAM:  Blood pressure 116/80, pulse 88, temperature 98.1 F (36.7 C), height 5' (1.524 m), weight 155 lb (70.3 kg), SpO2 99%. Body mass index is 30.27 kg/m.  General: She is overweight, cooperative, alert, well developed, and in no acute distress. PSYCH: Has normal mood, affect and thought process.   HEENT: EOMI, sclerae are anicteric. Lungs: Normal breathing effort, no conversational dyspnea. Extremities: No edema.  Neurologic: No gross sensory or motor deficits. No tremors or fasciculations noted.    DIAGNOSTIC DATA REVIEWED:  BMET    Component Value Date/Time   NA 142 01/21/2023 0830   K 4.5 01/21/2023 0830   CL 104 01/21/2023 0830   CO2 25 01/21/2023 0830   GLUCOSE 81 01/21/2023 0830   GLUCOSE 82 07/29/2022 0734   BUN 12 01/21/2023 0830   CREATININE 0.66  01/21/2023 0830   CREATININE 0.61 06/19/2020 0921   CALCIUM 9.6 01/21/2023 0830   GFRNONAA 109 06/19/2020 0921   GFRAA 127 06/19/2020 0921   Lab Results  Component Value Date   HGBA1C 5.7 (H) 01/21/2023   Lab Results  Component Value Date   INSULIN 12.2 01/21/2023   Lab Results  Component Value Date   TSH 0.69 07/29/2022   CBC    Component Value Date/Time   WBC 5.3 01/21/2023 0830   WBC 3.3 (L) 12/10/2017 1141   RBC 4.35 01/21/2023 0830   RBC 4.36 12/10/2017 1141   HGB 12.8 01/21/2023 0830   HCT 38.5 01/21/2023 0830   PLT 204.0 12/10/2017 1141   MCV 89 01/21/2023 0830   MCH 29.4 01/21/2023 0830   MCH 25.7 (L) 03/17/2013 1030   MCHC 33.2 01/21/2023 0830   MCHC 34.0 12/10/2017 1141   RDW 13.1 01/21/2023 0830   Iron Studies No results found for: "IRON", "TIBC", "FERRITIN", "IRONPCTSAT" Lipid Panel     Component Value Date/Time   CHOL 161 07/29/2022 0734   TRIG 78.0 07/29/2022 0734   HDL 46.20 07/29/2022 0734   CHOLHDL 3 07/29/2022 0734   VLDL 15.6 07/29/2022 0734   LDLCALC 100 (H) 07/29/2022 0734   LDLCALC 92 06/19/2020 0921   Hepatic Function Panel     Component Value Date/Time   PROT 7.5 01/21/2023 0830  ALBUMIN 4.4 01/21/2023 0830   AST 22 01/21/2023 0830   ALT 27 01/21/2023 0830   ALKPHOS 86 01/21/2023 0830   BILITOT 0.4 01/21/2023 0830      Component Value Date/Time   TSH 0.69 07/29/2022 0734   Nutritional Lab Results  Component Value Date   VD25OH 32.7 01/21/2023     Return in about 3 weeks (around 06/18/2023) for For Weight Mangement with Dr. Rikki Spearing.Marland Kitchen She was informed of the importance of frequent follow up visits to maximize her success with intensive lifestyle modifications for her multiple health conditions.   ATTESTASTION STATEMENTS:  Reviewed by clinician on day of visit: allergies, medications, problem list, medical history, surgical history, family history, social history, and previous encounter notes.     Worthy Rancher,  MD

## 2023-05-28 NOTE — Assessment & Plan Note (Signed)
On metformin twice daily without any adverse effects.  She benefits from incretin therapy for diabetes prevention and also weight management.  Her insurance does not cover GLP-1 therapy.  She will continue on metformin for pharmacoprophylaxis along with nutritional and behavioral strategies for weight loss.

## 2023-05-28 NOTE — Assessment & Plan Note (Signed)
We reviewed indirect calorimetry results in comparison to calculated there might be an overestimation of caloric requirements this may explain slow rate of weight loss.  Based on 24-hour recall I do not think she is exceeding 1200 cal but she is also not getting an adequate amount of protein.  We reviewed ways to increase protein intake.  Patient also benefits from antiobesity medication.  She will be started on phentermine.  We will assess clinical response in 3 to 4 weeks.

## 2023-05-30 ENCOUNTER — Other Ambulatory Visit: Payer: Self-pay | Admitting: Family Medicine

## 2023-05-30 DIAGNOSIS — F411 Generalized anxiety disorder: Secondary | ICD-10-CM

## 2023-06-26 ENCOUNTER — Ambulatory Visit (INDEPENDENT_AMBULATORY_CARE_PROVIDER_SITE_OTHER): Payer: Managed Care, Other (non HMO) | Admitting: Internal Medicine

## 2023-06-30 ENCOUNTER — Ambulatory Visit (INDEPENDENT_AMBULATORY_CARE_PROVIDER_SITE_OTHER): Payer: Managed Care, Other (non HMO) | Admitting: Internal Medicine

## 2023-06-30 ENCOUNTER — Encounter (INDEPENDENT_AMBULATORY_CARE_PROVIDER_SITE_OTHER): Payer: Self-pay | Admitting: Internal Medicine

## 2023-06-30 VITALS — BP 126/75 | HR 77 | Temp 97.9°F | Ht 60.0 in | Wt 152.0 lb

## 2023-06-30 DIAGNOSIS — Z6830 Body mass index (BMI) 30.0-30.9, adult: Secondary | ICD-10-CM | POA: Diagnosis not present

## 2023-06-30 DIAGNOSIS — E66811 Obesity, class 1: Secondary | ICD-10-CM

## 2023-06-30 DIAGNOSIS — R7303 Prediabetes: Secondary | ICD-10-CM | POA: Diagnosis not present

## 2023-06-30 DIAGNOSIS — Z6829 Body mass index (BMI) 29.0-29.9, adult: Secondary | ICD-10-CM

## 2023-06-30 DIAGNOSIS — Z723 Lack of physical exercise: Secondary | ICD-10-CM | POA: Insufficient documentation

## 2023-06-30 MED ORDER — METFORMIN HCL ER 500 MG PO TB24: 500.0000 mg | ORAL_TABLET | Freq: Two times a day (BID) | ORAL | 1 refills | Status: DC

## 2023-06-30 MED ORDER — PHENTERMINE HCL 37.5 MG PO TABS: 18.7500 mg | ORAL_TABLET | Freq: Every day | ORAL | 1 refills | Status: DC

## 2023-06-30 NOTE — Progress Notes (Signed)
Office: [phone]  /  Fax: [phone]  Weight Summary And Biometrics  Vitals Temp: 97.9 F (36.6 C) BP: 126/75 Pulse Rate: 77 SpO2: 99 %   Anthropometric Measurements Height: 5' (1.524 m) Weight: 152 lb (68.9 kg) BMI (Calculated): 29.69 Weight at Last Visit: 155 lb Weight Lost Since Last Visit: 3 lb Weight Gained Since Last Visit: 0 lb Starting Weight: 157 lb Total Weight Loss (lbs): 6 lb (2.722 kg) Peak Weight: 165 lb   Body Composition  Body Fat %: 35.3 % Fat Mass (lbs): 53.8 lbs Muscle Mass (lbs): 93.4 lbs Total Body Water (lbs): 62.4 lbs Visceral Fat Rating : 8    No data recorded Today's Visit #: 5  Starting Date: 01/21/23   Subjective   Chief Complaint: Obesity  Madison Robinson is here to discuss her progress with her obesity treatment plan. She is on the the Category 2 Plan and states she is following her eating plan approximately 75-80 % of the time. She states she is not exercising.  Interval History:   Discussed the use of AI scribe software for clinical note transcription with the patient, who gave verbal consent to proceed.  History of Present Illness   The patient, a 47 year old individual with obesity and prediabetes, presents for a medical weight management consultation. She is currently on metformin XR and phentermine for appetite control. The patient's peak weight was 165 pounds, and she has successfully reduced it to 152 pounds. She attributes this weight loss to adherence to a meal plan involving three meals a day.  The patient reports that the phentermine is effective in suppressing her appetite and providing her with increased energy. She initially took the full 37.5 mg dose of phentermine before realizing she should have halved it, but she did not experience any adverse effects such as sleep disturbances or palpitations.  Despite her progress, the patient acknowledges that she is not currently engaging in regular exercise due to a  demanding work schedule. She expresses interest in incorporating walking into her workday and considering the purchase of a mobile walking pad for home use.  The patient is also on sertraline for anxiety and is currently experiencing menopause. She has not reported any new symptoms or changes in her health status since the last consultation.       Orexigenic Control:  Denies problems with appetite and hunger signals.  Denies problems with satiety and satiation.  Denies problems with eating patterns and portion control.  Denies abnormal cravings. Denies feeling deprived or restricted.   Barriers identified: low volume of physical activity at present  and work schedule.   Pharmacotherapy for weight loss: She is currently taking Metformin (off label use for incretin effect and / or insulin resistance and / or diabetes prevention) with adequate clinical response  and without side effects. and Phentermine (longterm use, single agent)  with adequate clinical response  and without side effects..   Assessment and Plan   Treatment Plan For Obesity:  Recommended Dietary Goals  Madison Robinson is currently in the action stage of change. As such, her goal is to continue weight management plan. She has agreed to: continue current plan  Behavioral Intervention  We discussed the following Behavioral Modification Strategies today: continue to work on maintaining a reduced calorie state, getting the recommended amount of protein, incorporating whole foods, making healthy choices, staying well hydrated and practicing mindfulness when eating..  Additional resources provided today: None  Recommended Physical Activity Goals  Madison Robinson has been advised to work up  to 150 minutes of moderate intensity aerobic activity a week and strengthening exercises 2-3 times per week for cardiovascular health, weight loss maintenance and preservation of muscle mass.   She has agreed to :  Think about enjoyable ways to  increase daily physical activity and overcoming barriers to exercise and Increase physical activity in their day and reduce sedentary time (increase NEAT).  Pharmacotherapy  We discussed various medication options to help Madison Robinson with her weight loss efforts and we both agreed to : continue current anti-obesity medication regimen  Associated Conditions Addressed Today  Physically inactive Assessment & Plan: Patient counseled on the benefits of regular physical activity and risk associated with sedentary lifestyle.  She will work on overcoming obstacles   Prediabetes Assessment & Plan: She had an hemoglobin A1c of 5.7 and is currently on metformin XR 500 mg twice daily for pharmacoprophylaxis without any adverse effects.  She will continue current regimen.  She will continue to work on reducing simple and added sugars in her diet and increasing physical activity levels.  Orders: -     metFORMIN HCl ER; Take 1 tablet (500 mg total) by mouth 2 (two) times daily with a meal.  Dispense: 60 tablet; Refill: 1  Class 1 obesity with serious comorbidity and body mass index (BMI) of 30.0 to 30.9 in adult, unspecified obesity type Assessment & Plan: Patient has started to lose weight on phentermine 18.75 mg once a day without any adverse effects.  Her blood pressure and heart rate are normal.  She will continue at current dose.  We discussed increasing physical activity levels.  Orders: -     Phentermine HCl; Take 0.5 tablets (18.75 mg total) by mouth daily before breakfast.  Dispense: 15 tablet; Refill: 1      Objective   Physical Exam:  Blood pressure 126/75, pulse 77, temperature 97.9 F (36.6 C), height 5' (1.524 m), weight 152 lb (68.9 kg), SpO2 99%. Body mass index is 29.69 kg/m.  General: She is overweight, cooperative, alert, well developed, and in no acute distress. PSYCH: Has normal mood, affect and thought process.   HEENT: EOMI, sclerae are anicteric. Lungs: Normal  breathing effort, no conversational dyspnea. Extremities: No edema.  Neurologic: No gross sensory or motor deficits. No tremors or fasciculations noted.    Diagnostic Data Reviewed:  BMET    Component Value Date/Time   NA 142 01/21/2023 0830   K 4.5 01/21/2023 0830   CL 104 01/21/2023 0830   CO2 25 01/21/2023 0830   GLUCOSE 81 01/21/2023 0830   GLUCOSE 82 07/29/2022 0734   BUN 12 01/21/2023 0830   CREATININE 0.66 01/21/2023 0830   CREATININE 0.61 06/19/2020 0921   CALCIUM 9.6 01/21/2023 0830   GFRNONAA 109 06/19/2020 0921   GFRAA 127 06/19/2020 0921   Lab Results  Component Value Date   HGBA1C 5.7 (H) 01/21/2023   Lab Results  Component Value Date   INSULIN 12.2 01/21/2023   Lab Results  Component Value Date   TSH 0.69 07/29/2022   CBC    Component Value Date/Time   WBC 5.3 01/21/2023 0830   WBC 3.3 (L) 12/10/2017 1141   RBC 4.35 01/21/2023 0830   RBC 4.36 12/10/2017 1141   HGB 12.8 01/21/2023 0830   HCT 38.5 01/21/2023 0830   PLT 204.0 12/10/2017 1141   MCV 89 01/21/2023 0830   MCH 29.4 01/21/2023 0830   MCH 25.7 (L) 03/17/2013 1030   MCHC 33.2 01/21/2023 0830   MCHC 34.0 12/10/2017 1141  RDW 13.1 01/21/2023 0830   Iron Studies No results found for: "IRON", "TIBC", "FERRITIN", "IRONPCTSAT" Lipid Panel     Component Value Date/Time   CHOL 161 07/29/2022 0734   TRIG 78.0 07/29/2022 0734   HDL 46.20 07/29/2022 0734   CHOLHDL 3 07/29/2022 0734   VLDL 15.6 07/29/2022 0734   LDLCALC 100 (H) 07/29/2022 0734   LDLCALC 92 06/19/2020 0921   Hepatic Function Panel     Component Value Date/Time   PROT 7.5 01/21/2023 0830   ALBUMIN 4.4 01/21/2023 0830   AST 22 01/21/2023 0830   ALT 27 01/21/2023 0830   ALKPHOS 86 01/21/2023 0830   BILITOT 0.4 01/21/2023 0830      Component Value Date/Time   TSH 0.69 07/29/2022 0734   Nutritional Lab Results  Component Value Date   VD25OH 32.7 01/21/2023    Follow-Up   Return in about 4 weeks (around 07/28/2023)  for For Weight Mangement with Dr. Rikki Spearing.Marland Kitchen She was informed of the importance of frequent follow up visits to maximize her success with intensive lifestyle modifications for her multiple health conditions.  Attestation Statement   Reviewed by clinician on day of visit: allergies, medications, problem list, medical history, surgical history, family history, social history, and previous encounter notes.     Worthy Rancher, MD

## 2023-06-30 NOTE — Assessment & Plan Note (Addendum)
Patient has started to lose weight on phentermine 18.75 mg once a day without any adverse effects.  Her blood pressure and heart rate are normal.  She will continue at current dose.  We discussed increasing physical activity levels.

## 2023-06-30 NOTE — Assessment & Plan Note (Signed)
Patient counseled on the benefits of regular physical activity and risk associated with sedentary lifestyle.  She will work on overcoming obstacles

## 2023-06-30 NOTE — Assessment & Plan Note (Signed)
She had an hemoglobin A1c of 5.7 and is currently on metformin XR 500 mg twice daily for pharmacoprophylaxis without any adverse effects.  She will continue current regimen.  She will continue to work on reducing simple and added sugars in her diet and increasing physical activity levels.

## 2023-08-05 ENCOUNTER — Ambulatory Visit (INDEPENDENT_AMBULATORY_CARE_PROVIDER_SITE_OTHER): Payer: Managed Care, Other (non HMO) | Admitting: Internal Medicine

## 2023-08-05 ENCOUNTER — Encounter (INDEPENDENT_AMBULATORY_CARE_PROVIDER_SITE_OTHER): Payer: Self-pay | Admitting: Internal Medicine

## 2023-08-05 DIAGNOSIS — R7303 Prediabetes: Secondary | ICD-10-CM

## 2023-08-05 DIAGNOSIS — E66811 Obesity, class 1: Secondary | ICD-10-CM

## 2023-08-05 DIAGNOSIS — Z6830 Body mass index (BMI) 30.0-30.9, adult: Secondary | ICD-10-CM

## 2023-08-05 MED ORDER — METFORMIN HCL ER 500 MG PO TB24: 500.0000 mg | ORAL_TABLET | Freq: Two times a day (BID) | ORAL | 1 refills | Status: DC

## 2023-08-05 MED ORDER — PHENTERMINE HCL 37.5 MG PO TABS: 18.7500 mg | ORAL_TABLET | Freq: Every day | ORAL | 1 refills | Status: DC

## 2023-08-05 NOTE — Progress Notes (Signed)
 Office: [phone]  /  Fax: [phone]  Weight Summary And Biometrics  Vitals Temp: 97.9 F (36.6 C) BP: 124/80 Pulse Rate: 85 SpO2: 96 %   Anthropometric Measurements Height: 5' (1.524 m) Weight: 150 lb (68 kg) BMI (Calculated): 29.3 Weight at Last Visit: 152 lb Weight Lost Since Last Visit: 2 lb Weight Gained Since Last Visit: 0 lb Starting Weight: 157 lb Total Weight Loss (lbs): 8 lb (3.629 kg) Peak Weight: 165 lb   Body Composition  Body Fat %: 34 % Fat Mass (lbs): 51.2 lbs Muscle Mass (lbs): 94.4 lbs Total Body Water (lbs): 63.2 lbs Visceral Fat Rating : 7    No data recorded Today's Visit #: 6  Starting Date: 01/21/23   Subjective   Chief Complaint: Obesity  Madison Robinson is here to discuss her progress with her obesity treatment plan. She is on the the Category 2 Plan and states she is following her eating plan approximately 60 % of the time. She states she is not exercising.  Interval History:   Since last office visit she has lost 2 pounds. She reports fair adherence to reduced calorie nutritional plan. She has been working on reading food labels, not skipping meals, increasing protein intake at every meal, drinking more water, making healthier choices, reducing portion sizes, and incorporating more whole foods    Orexigenic Control:  Denies problems with appetite and hunger signals.  Denies problems with satiety and satiation.  Denies problems with eating patterns and portion control.  Denies abnormal cravings. Denies feeling deprived or restricted.   Barriers identified: lack of time for self-care.   Pharmacotherapy for weight loss: She is currently taking Metformin  (off label use for incretin effect and / or insulin  resistance and / or diabetes prevention) with adequate clinical response  and without side effects. and Phentermine  (longterm use, single agent)  with adequate clinical response  and without side effects..   Assessment and  Plan   Treatment Plan For Obesity:  Recommended Dietary Goals  Madison Robinson is currently in the action stage of change. As such, her goal is to continue weight management plan. She has agreed to: continue current plan  Behavioral Intervention  We discussed the following Behavioral Modification Strategies today: continue to work on maintaining a reduced calorie state, getting the recommended amount of protein, incorporating whole foods, making healthy choices, staying well hydrated and practicing mindfulness when eating..  Additional resources provided today: None  Recommended Physical Activity Goals  Madison Robinson has been advised to work up to 150 minutes of moderate intensity aerobic activity a week and strengthening exercises 2-3 times per week for cardiovascular health, weight loss maintenance and preservation of muscle mass.   She has agreed to :  Think about enjoyable ways to increase daily physical activity and overcoming barriers to exercise and Increase physical activity in their day and reduce sedentary time (increase NEAT).  Pharmacotherapy  We discussed various medication options to help Madison Robinson with her weight loss efforts and we both agreed to : continue current anti-obesity medication regimen  Associated Conditions Addressed and Impacted by Obesity Treatment  Prediabetes Assessment & Plan: She had an hemoglobin A1c of 5.7 and is currently on metformin  XR 500 mg twice daily for pharmacoprophylaxis without any adverse effects.  She will continue current regimen.  She will continue to work on reducing simple and added sugars in her diet and increasing physical activity levels.  Orders: -     metFORMIN  HCl ER; Take 1 tablet (500 mg total) by  mouth 2 (two) times daily with a meal.  Dispense: 60 tablet; Refill: 1  Class 1 obesity with serious comorbidity and body mass index (BMI) of 30.0 to 30.9 in adult, unspecified obesity type Assessment & Plan: Patient has lost 14 pounds  since May 2024 her goal is to reach 140 pounds which is 10 more pounds over the next 2 to 3 months.  She is currently on a reduced calorie nutrition plan and metformin  and phentermine  for pharmacotherapy.  Tolerating treatment well without any adverse effects.  She will continue medically supervised weight management program.  We reviewed elements necessary for sessile weight loss maintenance.  Orders: -     Phentermine  HCl; Take 0.5 tablets (18.75 mg total) by mouth daily before breakfast.  Dispense: 15 tablet; Refill: 1     Objective   Physical Exam:  Blood pressure 124/80, pulse 85, temperature 97.9 F (36.6 C), height 5' (1.524 m), weight 150 lb (68 kg), SpO2 96%. Body mass index is 29.29 kg/m.  General: She is overweight, cooperative, alert, well developed, and in no acute distress. PSYCH: Has normal mood, affect and thought process.   HEENT: EOMI, sclerae are anicteric. Lungs: Normal breathing effort, no conversational dyspnea. Extremities: No edema.  Neurologic: No gross sensory or motor deficits. No tremors or fasciculations noted.    Diagnostic Data Reviewed:  BMET    Component Value Date/Time   NA 142 01/21/2023 0830   K 4.5 01/21/2023 0830   CL 104 01/21/2023 0830   CO2 25 01/21/2023 0830   GLUCOSE 81 01/21/2023 0830   GLUCOSE 82 07/29/2022 0734   BUN 12 01/21/2023 0830   CREATININE 0.66 01/21/2023 0830   CREATININE 0.61 06/19/2020 0921   CALCIUM 9.6 01/21/2023 0830   GFRNONAA 109 06/19/2020 0921   GFRAA 127 06/19/2020 0921   Lab Results  Component Value Date   HGBA1C 5.7 (H) 01/21/2023   Lab Results  Component Value Date   INSULIN  12.2 01/21/2023   Lab Results  Component Value Date   TSH 0.69 07/29/2022   CBC    Component Value Date/Time   WBC 5.3 01/21/2023 0830   WBC 3.3 (L) 12/10/2017 1141   RBC 4.35 01/21/2023 0830   RBC 4.36 12/10/2017 1141   HGB 12.8 01/21/2023 0830   HCT 38.5 01/21/2023 0830   PLT 204.0 12/10/2017 1141   MCV 89  01/21/2023 0830   MCH 29.4 01/21/2023 0830   MCH 25.7 (L) 03/17/2013 1030   MCHC 33.2 01/21/2023 0830   MCHC 34.0 12/10/2017 1141   RDW 13.1 01/21/2023 0830   Iron Studies No results found for: IRON, TIBC, FERRITIN, IRONPCTSAT Lipid Panel     Component Value Date/Time   CHOL 161 07/29/2022 0734   TRIG 78.0 07/29/2022 0734   HDL 46.20 07/29/2022 0734   CHOLHDL 3 07/29/2022 0734   VLDL 15.6 07/29/2022 0734   LDLCALC 100 (H) 07/29/2022 0734   LDLCALC 92 06/19/2020 0921   Hepatic Function Panel     Component Value Date/Time   PROT 7.5 01/21/2023 0830   ALBUMIN 4.4 01/21/2023 0830   AST 22 01/21/2023 0830   ALT 27 01/21/2023 0830   ALKPHOS 86 01/21/2023 0830   BILITOT 0.4 01/21/2023 0830      Component Value Date/Time   TSH 0.69 07/29/2022 0734   Nutritional Lab Results  Component Value Date   VD25OH 32.7 01/21/2023    Follow-Up   Return in about 6 weeks (around 09/16/2023) for For Weight Mangement with Dr. [email or web address].[email or web address]  She was informed of the importance of frequent follow up visits to maximize her success with intensive lifestyle modifications for her multiple health conditions.  Attestation Statement   Reviewed by clinician on day of visit: allergies, medications, problem list, medical history, surgical history, family history, social history, and previous encounter notes.     Lucas Parker, MD

## 2023-08-06 NOTE — Assessment & Plan Note (Signed)
 She had an hemoglobin A1c of 5.7 and is currently on metformin XR 500 mg twice daily for pharmacoprophylaxis without any adverse effects.  She will continue current regimen.  She will continue to work on reducing simple and added sugars in her diet and increasing physical activity levels.

## 2023-08-06 NOTE — Assessment & Plan Note (Signed)
 Patient has lost 14 pounds since May 2024 her goal is to reach 140 pounds which is 10 more pounds over the next 2 to 3 months.  She is currently on a reduced calorie nutrition plan and metformin  and phentermine  for pharmacotherapy.  Tolerating treatment well without any adverse effects.  She will continue medically supervised weight management program.  We reviewed elements necessary for sessile weight loss maintenance.

## 2023-09-10 ENCOUNTER — Other Ambulatory Visit: Payer: Self-pay | Admitting: Family Medicine

## 2023-09-10 DIAGNOSIS — F411 Generalized anxiety disorder: Secondary | ICD-10-CM

## 2023-09-16 ENCOUNTER — Ambulatory Visit (INDEPENDENT_AMBULATORY_CARE_PROVIDER_SITE_OTHER): Payer: Managed Care, Other (non HMO) | Admitting: Internal Medicine

## 2023-09-16 ENCOUNTER — Encounter (INDEPENDENT_AMBULATORY_CARE_PROVIDER_SITE_OTHER): Payer: Self-pay | Admitting: Internal Medicine

## 2023-09-16 DIAGNOSIS — E66811 Obesity, class 1: Secondary | ICD-10-CM | POA: Diagnosis not present

## 2023-09-16 DIAGNOSIS — Z6830 Body mass index (BMI) 30.0-30.9, adult: Secondary | ICD-10-CM

## 2023-09-16 DIAGNOSIS — R7303 Prediabetes: Secondary | ICD-10-CM

## 2023-09-16 MED ORDER — PHENTERMINE HCL 37.5 MG PO TABS: 37.5000 mg | ORAL_TABLET | Freq: Every day | ORAL | 0 refills | Status: DC

## 2023-09-16 MED ORDER — METFORMIN HCL ER 500 MG PO TB24: 500.0000 mg | ORAL_TABLET | Freq: Two times a day (BID) | ORAL | 1 refills | Status: DC

## 2023-09-16 NOTE — Progress Notes (Signed)
 Office: [phone]  /  Fax: [phone]  Weight Summary And Biometrics  Vitals Temp: 97.8 F (36.6 C) BP: 100/69 Pulse Rate: 77 SpO2: 100 %   Anthropometric Measurements Height: 5' (1.524 m) Weight: 151 lb (68.5 kg) BMI (Calculated): 29.49 Weight at Last Visit: 150 lb Weight Lost Since Last Visit: 0 lb Weight Gained Since Last Visit: 1 lb Starting Weight: 157 lb Total Weight Loss (lbs): 7 lb (3.175 kg) Peak Weight: 165 lb   Body Composition  Body Fat %: 34.5 % Fat Mass (lbs): 52.2 lbs Muscle Mass (lbs): 94 lbs Total Body Water (lbs): 63.2 lbs Visceral Fat Rating : 8    No data recorded Today's Visit #: 7  Starting Date: 01/21/23   Subjective   Chief Complaint: Obesity  Madison Robinson is here to discuss her progress with her obesity treatment plan. She is on the the Category 2 Plan and states she is following her eating plan approximately 70-80% of the time. She states she is exercising 20  minutes 4 times per week.  Weight Progress Since Last Visit:  Since last office visit she has gained 1 pounds. She reports good adherence to reduced calorie nutritional plan.  Reports following nutritional recommendations with consistent meal planning Uses none to track intake.  Engages in occasional physical activity.   She has been working on reading food labels, not skipping meals, increasing protein intake at every meal, drinking more water, making healthier choices, reducing portion sizes, and incorporating more whole foods   Challenges affecting patient progress: strong hunger signals and/or impaired satiety / inhibitory control.   Orexigenic Control: Reports problems with appetite and hunger signals.  Denies problems with satiety and satiation.  Denies problems with eating patterns and portion control.  Denies abnormal cravings. Denies feeling deprived or restricted.   Pharmacotherapy for weight management: She is currently taking Metformin (off label use  for incretin effect and / or insulin resistance and / or diabetes prevention) with adequate clinical response  and without side effects. and Phentermine (longterm use, single agent)  with adequate clinical response  and without side effects..   Assessment and Plan   Treatment Plan For Obesity:  Recommended Dietary Goals  Madison Robinson is currently in the action stage of change. As such, her goal is to continue weight management plan. She has agreed to: switch to AI generated 1000-calorie 7-day meal plan, high-protein , may incorporate a meal replacement at dinner due to patient preference  Behavioral Health and Counseling  We discussed the following behavioral modification strategies today: continue to work on maintaining a reduced calorie state, getting the recommended amount of protein, incorporating whole foods, making healthy choices, staying well hydrated and practicing mindfulness when eating..  Additional education and resources provided today: Samples of on unjury products  Recommended Physical Activity Goals  Madison Robinson has been advised to work up to 150 minutes of moderate intensity aerobic activity a week and strengthening exercises 2-3 times per week for cardiovascular health, weight loss maintenance and preservation of muscle mass.   She has agreed to :  continue to gradually increase the amount and intensity of exercise routine  Pharmacotherapy  We discussed various medication options to help Madison Robinson with her weight loss efforts and we both agreed to : increase phentermine to 37.5 mg once a week  Associated Conditions Impacted by Obesity Treatment  1. Prediabetes Stable on metformin XR 500 mg twice a day without any adverse effects continue current medication.  Repeat disease monitoring labs fasting in 2  months  2. Class 1 obesity with serious comorbidity and body mass index (BMI) of 30.0 to 30.9 in adult, unspecified obesity type She has hit a plateau she also notes  increasing hunger.  She has been on phentermine since November of last year and has lost approximately 6 pounds.  We will increase medication to 37.5 mg if she does not lose additional weight on the higher dose we will consider discontinuing medication.  We will change her plan to 1000-calorie high-protein meal plan 7-day meal plan provided.    Objective   Physical Exam:  Blood pressure 100/69, pulse 77, temperature 97.8 F (36.6 C), height 5' (1.524 m), weight 151 lb (68.5 kg), SpO2 100%. Body mass index is 29.49 kg/m.  General: She is overweight, cooperative, alert, well developed, and in no acute distress. PSYCH: Has normal mood, affect and thought process.   HEENT: EOMI, sclerae are anicteric. Lungs: Normal breathing effort, no conversational dyspnea. Extremities: No edema.  Neurologic: No gross sensory or motor deficits. No tremors or fasciculations noted.    Diagnostic Data Reviewed:  BMET    Component Value Date/Time   NA 142 01/21/2023 0830   K 4.5 01/21/2023 0830   CL 104 01/21/2023 0830   CO2 25 01/21/2023 0830   GLUCOSE 81 01/21/2023 0830   GLUCOSE 82 07/29/2022 0734   BUN 12 01/21/2023 0830   CREATININE 0.66 01/21/2023 0830   CREATININE 0.61 06/19/2020 0921   CALCIUM 9.6 01/21/2023 0830   GFRNONAA 109 06/19/2020 0921   GFRAA 127 06/19/2020 0921   Lab Results  Component Value Date   HGBA1C 5.7 (H) 01/21/2023   Lab Results  Component Value Date   INSULIN 12.2 01/21/2023   Lab Results  Component Value Date   TSH 0.69 07/29/2022   CBC    Component Value Date/Time   WBC 5.3 01/21/2023 0830   WBC 3.3 (L) 12/10/2017 1141   RBC 4.35 01/21/2023 0830   RBC 4.36 12/10/2017 1141   HGB 12.8 01/21/2023 0830   HCT 38.5 01/21/2023 0830   PLT 204.0 12/10/2017 1141   MCV 89 01/21/2023 0830   MCH 29.4 01/21/2023 0830   MCH 25.7 (L) 03/17/2013 1030   MCHC 33.2 01/21/2023 0830   MCHC 34.0 12/10/2017 1141   RDW 13.1 01/21/2023 0830   Iron Studies No results  found for: "IRON", "TIBC", "FERRITIN", "IRONPCTSAT" Lipid Panel     Component Value Date/Time   CHOL 161 07/29/2022 0734   TRIG 78.0 07/29/2022 0734   HDL 46.20 07/29/2022 0734   CHOLHDL 3 07/29/2022 0734   VLDL 15.6 07/29/2022 0734   LDLCALC 100 (H) 07/29/2022 0734   LDLCALC 92 06/19/2020 0921   Hepatic Function Panel     Component Value Date/Time   PROT 7.5 01/21/2023 0830   ALBUMIN 4.4 01/21/2023 0830   AST 22 01/21/2023 0830   ALT 27 01/21/2023 0830   ALKPHOS 86 01/21/2023 0830   BILITOT 0.4 01/21/2023 0830      Component Value Date/Time   TSH 0.69 07/29/2022 0734   Nutritional Lab Results  Component Value Date   VD25OH 32.7 01/21/2023    Follow-Up   No follow-ups on file.Marland Kitchen She was informed of the importance of frequent follow up visits to maximize her success with intensive lifestyle modifications for her multiple health conditions.  Attestation Statement   Reviewed by clinician on day of visit: allergies, medications, problem list, medical history, surgical history, family history, social history, and previous encounter notes.  Worthy Rancher, MD

## 2023-09-16 NOTE — Progress Notes (Deleted)
 Office: [phone]  /  Fax: [phone]  Weight Summary And Biometrics  Vitals Temp: 97.8 F (36.6 C) BP: 100/69 Pulse Rate: 77 SpO2: 100 %   Anthropometric Measurements Height: 5' (1.524 m) Weight: 151 lb (68.5 kg) BMI (Calculated): 29.49 Weight at Last Visit: 150 lb Weight Lost Since Last Visit: 0 lb Weight Gained Since Last Visit: 1 lb Starting Weight: 157 lb Total Weight Loss (lbs): 7 lb (3.175 kg) Peak Weight: 165 lb   Body Composition  Body Fat %: 34.5 % Fat Mass (lbs): 52.2 lbs Muscle Mass (lbs): 94 lbs Total Body Water (lbs): 63.2 lbs Visceral Fat Rating : 8    No data recorded Today's Visit #: 7  Starting Date: 01/21/23   Subjective   Chief Complaint: Obesity  Discussed the use of AI scribe software for clinical note transcription with the patient, who gave verbal consent to proceed.  History of Present Illness             Assessment and Plan   Treatment Plan For Obesity:  Recommended Dietary Goals  Iliana is currently in the action stage of change. As such, her goal is to continue weight management plan. She has agreed to: {EMWTLOSSPLAN:29297::"continue current plan"}  Behavioral Health and Counseling  We discussed the following behavioral modification strategies today: {EMWMwtlossstrategies:28914::"continue to work on maintaining a reduced calorie state, getting the recommended amount of protein, incorporating whole foods, making healthy choices, staying well hydrated and practicing mindfulness when eating."}.  Additional education and resources provided today: {EMadditionalresources:29169::"None"}  Recommended Physical Activity Goals  Apoorva has been advised to work up to 150 minutes of moderate intensity aerobic activity a week and strengthening exercises 2-3 times per week for cardiovascular health, weight loss maintenance and preservation of muscle mass.   She has agreed to :  {EMEXERCISE:28847::"Think about enjoyable  ways to increase daily physical activity and overcoming barriers to exercise","Increase physical activity in their day and reduce sedentary time (increase NEAT)."}  Pharmacotherapy  We discussed various medication options to help [name] with her weight loss efforts and we both agreed to : {EMagreedrx:29170}  Associated Conditions Impacted by Obesity Treatment  Prediabetes  Class 1 obesity with serious comorbidity and body mass index (BMI) of 30.0 to 30.9 in adult, unspecified obesity type   Assessment and Plan               Objective   Physical Exam:  Blood pressure 100/69, pulse 77, temperature 97.8 F (36.6 C), height 5' (1.524 m), weight 151 lb (68.5 kg), SpO2 100%. Body mass index is 29.49 kg/m.  General: She is overweight, cooperative, alert, well developed, and in no acute distress. PSYCH: Has normal mood, affect and thought process.   HEENT: EOMI, sclerae are anicteric. Lungs: Normal breathing effort, no conversational dyspnea. Extremities: No edema.  Neurologic: No gross sensory or motor deficits. No tremors or fasciculations noted.    Diagnostic Data Reviewed:  BMET    Component Value Date/Time   NA 142 01/21/2023 0830   K 4.5 01/21/2023 0830   CL 104 01/21/2023 0830   CO2 25 01/21/2023 0830   GLUCOSE 81 01/21/2023 0830   GLUCOSE 82 07/29/2022 0734   BUN 12 01/21/2023 0830   CREATININE 0.66 01/21/2023 0830   CREATININE 0.61 06/19/2020 0921   CALCIUM 9.6 01/21/2023 0830   GFRNONAA 109 06/19/2020 0921   GFRAA 127 06/19/2020 0921   Lab Results  Component Value Date   HGBA1C 5.7 (H) 01/21/2023   Lab Results  Component  Value Date   INSULIN 12.2 01/21/2023   Lab Results  Component Value Date   TSH 0.69 07/29/2022   CBC    Component Value Date/Time   WBC 5.3 01/21/2023 0830   WBC 3.3 (L) 12/10/2017 1141   RBC 4.35 01/21/2023 0830   RBC 4.36 12/10/2017 1141   HGB 12.8 01/21/2023 0830   HCT 38.5 01/21/2023 0830   PLT 204.0 12/10/2017 1141    MCV 89 01/21/2023 0830   MCH 29.4 01/21/2023 0830   MCH 25.7 (L) 03/17/2013 1030   MCHC 33.2 01/21/2023 0830   MCHC 34.0 12/10/2017 1141   RDW 13.1 01/21/2023 0830   Iron Studies No results found for: "IRON", "TIBC", "FERRITIN", "IRONPCTSAT" Lipid Panel     Component Value Date/Time   CHOL 161 07/29/2022 0734   TRIG 78.0 07/29/2022 0734   HDL 46.20 07/29/2022 0734   CHOLHDL 3 07/29/2022 0734   VLDL 15.6 07/29/2022 0734   LDLCALC 100 (H) 07/29/2022 0734   LDLCALC 92 06/19/2020 0921   Hepatic Function Panel     Component Value Date/Time   PROT 7.5 01/21/2023 0830   ALBUMIN 4.4 01/21/2023 0830   AST 22 01/21/2023 0830   ALT 27 01/21/2023 0830   ALKPHOS 86 01/21/2023 0830   BILITOT 0.4 01/21/2023 0830      Component Value Date/Time   TSH 0.69 07/29/2022 0734   Nutritional Lab Results  Component Value Date   VD25OH 32.7 01/21/2023    Follow-Up   No follow-ups on file.Marland Kitchen She was informed of the importance of frequent follow up visits to maximize her success with intensive lifestyle modifications for her multiple health conditions.  Attestation Statement   Reviewed by clinician on day of visit: allergies, medications, problem list, medical history, surgical history, family history, social history, and previous encounter notes.     Worthy Rancher, MD

## 2023-10-21 ENCOUNTER — Encounter (INDEPENDENT_AMBULATORY_CARE_PROVIDER_SITE_OTHER): Payer: Self-pay | Admitting: Internal Medicine

## 2023-10-21 ENCOUNTER — Ambulatory Visit (INDEPENDENT_AMBULATORY_CARE_PROVIDER_SITE_OTHER): Payer: Managed Care, Other (non HMO) | Admitting: Internal Medicine

## 2023-10-21 DIAGNOSIS — Z6830 Body mass index (BMI) 30.0-30.9, adult: Secondary | ICD-10-CM | POA: Diagnosis not present

## 2023-10-21 DIAGNOSIS — R7303 Prediabetes: Secondary | ICD-10-CM | POA: Diagnosis not present

## 2023-10-21 DIAGNOSIS — E66811 Obesity, class 1: Secondary | ICD-10-CM | POA: Diagnosis not present

## 2023-10-21 MED ORDER — PHENTERMINE HCL 37.5 MG PO TABS: 18.7500 mg | ORAL_TABLET | Freq: Every day | ORAL | 0 refills | Status: DC

## 2023-10-21 MED ORDER — METFORMIN HCL ER 500 MG PO TB24: 500.0000 mg | ORAL_TABLET | Freq: Two times a day (BID) | ORAL | 1 refills | Status: DC

## 2023-10-21 MED ORDER — TOPIRAMATE 25 MG PO TABS: 25.0000 mg | ORAL_TABLET | Freq: Every evening | ORAL | 0 refills | Status: DC

## 2023-10-21 NOTE — Progress Notes (Signed)
 Office: [phone]  /  Fax: [phone]  Weight Summary And Biometrics  Vitals Temp: 97.6 F (36.4 C) BP: 126/81 Pulse Rate: (!) 111 SpO2: 100 %   Anthropometric Measurements Height: 5' (1.524 m) Weight: 155 lb (70.3 kg) BMI (Calculated): 30.27 Weight at Last Visit: 151 lb Weight Lost Since Last Visit: 0 lb Weight Gained Since Last Visit: 4 lb Starting Weight: 157 lb Total Weight Loss (lbs): 2 lb (0.907 kg) Peak Weight: 165 lb   Body Composition  Body Fat %: 35.5 % Fat Mass (lbs): 55 lbs Muscle Mass (lbs): 95 lbs Total Body Water (lbs): 63.8 lbs Visceral Fat Rating : 8    No data recorded Today's Visit #: 8  Starting Date: 01/21/23   Subjective   Chief Complaint: Obesity  Interval History Discussed the use of AI scribe software for clinical note transcription with the patient, who gave verbal consent to proceed.  History of Present Illness Madison Robinson is a 48 year old female who presents for medical weight management.  She has gained four pounds since her last office visit. She follows the category two plan about seventy percent of the time and is not tracking calories. She is consuming more whole foods and meeting the recommended protein intake. She does not skip meals and exercises three to four days a week for about thirty minutes each session. She reports adequate sleep and denies high levels of stress.  She is currently on phentermine 37.5 mg once a week, which was increased on February 25th. She started phentermine in November of the previous year, and initially, her weight decreased to 150 pounds by January of this year. However, since the increase in dosage, she has experienced a weight gain. No palpitations or changes in appetite with the higher dose, although she notes a possible increased sense of fullness compared to when she was on a lower dose.  Her weight history includes being 164 pounds in May of the previous year, decreasing to  158 pounds when she started the program, and reaching 150 pounds by January of this year. She has not traveled or changed her diet significantly since her last visit, although she acknowledges a possible decrease in water intake.    Challenges affecting patient progress: none.    Pharmacotherapy for weight management: She is currently taking Phentermine (longterm use, single agent)  with adequate clinical response  and without side effects..   Assessment and Plan   Treatment Plan For Obesity:  Recommended Dietary Goals  Madison Robinson is currently in the action stage of change. As such, her goal is to continue weight management plan. She has agreed to: keep a food journal with a target of  1200 calories per day and 90-120 grams of protein per day or 30-40 grams per meal.  Behavioral Health and Counseling  We discussed the following behavioral modification strategies today: increasing lean protein intake to established goals and work on tracking and journaling calories using tracking application.  Additional education and resources provided today: Handout and personalized instruction on tracking and journaling using Apps  Recommended Physical Activity Goals  Madison Robinson has been advised to work up to 150 minutes of moderate intensity aerobic activity a week and strengthening exercises 2-3 times per week for cardiovascular health, weight loss maintenance and preservation of muscle mass.   She has agreed to :  continue to gradually increase the amount and intensity of exercise routine  Pharmacotherapy  We discussed various medication options to help Madison Robinson with her weight  loss efforts and we both agreed to : start anti-obesity medication.  In addition to reduced calorie nutrition plan (RCNP), behavioral strategies and physical activity, Madison Robinson would benefit from pharmacotherapy to assist with hunger signals, satiety and cravings. This will reduce obesity-related health risks by inducing  weight loss, and help reduce food consumption and adherence to [facility]) . It may also improve QOL by improving self-confidence and reduce the  setbacks associated with metabolic adaptations.  After discussion of treatment options, mechanisms of action, benefits, side effects, contraindications and shared decision making she is agreeable to starting topiramate 25 mg in the evening. Patient also made aware that medication is indicated for long-term management of obesity and the risk of weight regain following discontinuation of treatment and hence the importance of adhering to medical weight loss plan.  We demonstrated use of device and patient using teach back method was able to demonstrate proper technique.  Associated Conditions Impacted by Obesity Treatment  Class 1 obesity with serious comorbidity and body mass index (BMI) of 30.0 to 30.9 in adult, unspecified obesity type  Prediabetes    Assessment and Plan Assessment & Plan Weight Gain Madison Robinson has gained four pounds since the last visit despite adherence to the category two plan approximately 70% of the time. She is consuming more whole foods, meeting protein recommendations, exercising three to four days weekly for 30 minutes, and reports adequate sleep and low stress. The weight gain is unexpected, especially after increasing phentermine dosage, which paradoxically led to weight gain. A shift in metabolic rate is suspected, necessitating recalibration of calorie intake.  - Calibrate calorie intake to 1200 calories per day, considering the new metabolic rate of 1610 calories. - Reduce phentermine dosage to half due to elevated heart rate today and introduce topiramate 25 mg to be taken in the evening. - Educate on using the My [employer] Diary app for tracking calories and protein intake. - Discuss the option of using Qsymia as a combination pill for convenience. - Consider the option of Zepbound for weight management if financially  feasible.  Tachycardia Madison Robinson's resting heart rate is elevated at 97 bpm, on arrival it was 111 likely related to the increased phentermine dosage, which can be stimulating to the heart. - Reduce phentermine dosage to half to mitigate its stimulating effects on the heart.  Prediabetes Check disease monitoring labs.  If levels have not improved, discontinuing metformin may be considered to reduce medication burden. - Order A1c and insulin level tests to evaluate the effectiveness of metformin. - Consider discontinuing metformin if A1c and insulin levels have not improved.  General Health Maintenance Madison Robinson is due for cholesterol screening as part of routine health maintenance. - Order cholesterol screening.  Follow-up Madison Robinson will be monitored for progress with the new medication regimen and lifestyle changes. - Schedule follow-up appointment in four weeks to assess progress with weight management and medication effects.      Objective   Physical Exam:  Blood pressure 126/81, pulse (!) 111, temperature 97.6 F (36.4 C), height 5' (1.524 m), weight 155 lb (70.3 kg), SpO2 100%. Body mass index is 30.27 kg/m.  General: She is overweight, cooperative, alert, well developed, and in no acute distress. PSYCH: Has normal mood, affect and thought process.   HEENT: EOMI, sclerae are anicteric. Lungs: Normal breathing effort, no conversational dyspnea. Extremities: No edema.  Neurologic: No gross sensory or motor deficits. No tremors or fasciculations noted.    Diagnostic Data Reviewed:  BMET    Component Value Date/Time  NA 142 01/21/2023 0830   K 4.5 01/21/2023 0830   CL 104 01/21/2023 0830   CO2 25 01/21/2023 0830   GLUCOSE 81 01/21/2023 0830   GLUCOSE 82 07/29/2022 0734   BUN 12 01/21/2023 0830   CREATININE 0.66 01/21/2023 0830   CREATININE 0.61 06/19/2020 0921   CALCIUM 9.6 01/21/2023 0830   GFRNONAA 109 06/19/2020 0921   GFRAA 127 06/19/2020 0921   Lab Results   Component Value Date   HGBA1C 5.7 (H) 01/21/2023   Lab Results  Component Value Date   INSULIN 12.2 01/21/2023   Lab Results  Component Value Date   TSH 0.69 07/29/2022   CBC    Component Value Date/Time   WBC 5.3 01/21/2023 0830   WBC 3.3 (L) 12/10/2017 1141   RBC 4.35 01/21/2023 0830   RBC 4.36 12/10/2017 1141   HGB 12.8 01/21/2023 0830   HCT 38.5 01/21/2023 0830   PLT 204.0 12/10/2017 1141   MCV 89 01/21/2023 0830   MCH 29.4 01/21/2023 0830   MCH 25.7 (L) 03/17/2013 1030   MCHC 33.2 01/21/2023 0830   MCHC 34.0 12/10/2017 1141   RDW 13.1 01/21/2023 0830   Iron Studies No results found for: "IRON", "TIBC", "FERRITIN", "IRONPCTSAT" Lipid Panel     Component Value Date/Time   CHOL 161 07/29/2022 0734   TRIG 78.0 07/29/2022 0734   HDL 46.20 07/29/2022 0734   CHOLHDL 3 07/29/2022 0734   VLDL 15.6 07/29/2022 0734   LDLCALC 100 (H) 07/29/2022 0734   LDLCALC 92 06/19/2020 0921   Hepatic Function Panel     Component Value Date/Time   PROT 7.5 01/21/2023 0830   ALBUMIN 4.4 01/21/2023 0830   AST 22 01/21/2023 0830   ALT 27 01/21/2023 0830   ALKPHOS 86 01/21/2023 0830   BILITOT 0.4 01/21/2023 0830      Component Value Date/Time   TSH 0.69 07/29/2022 0734   Nutritional Lab Results  Component Value Date   VD25OH 32.7 01/21/2023    Medications: Outpatient Encounter Medications as of 10/21/2023  Medication Sig   Cholecalciferol (VITAMIN D3) 50 MCG (2000 UT) capsule Take 1 capsule (2,000 Units total) by mouth daily.   hydrOXYzine (ATARAX) 25 MG tablet Take 1 tablet (25 mg total) by mouth every 12 (twelve) hours as needed for anxiety.   levonorgestrel (MIRENA) 20 MCG/24HR IUD Mirena 20 mcg/24 hours (5 yrs) 52 mg intrauterine device  Take 1 device by intrauterine route.   metFORMIN (GLUCOPHAGE-XR) 500 MG 24 hr tablet Take 1 tablet (500 mg total) by mouth 2 (two) times daily with a meal.   phentermine (ADIPEX-P) 37.5 MG tablet Take 1 tablet (37.5 mg total) by mouth  daily before breakfast.   sertraline (ZOLOFT) 50 MG tablet TAKE 1 TABLET BY MOUTH EVERY DAY   No facility-administered encounter medications on file as of 10/21/2023.     Follow-Up   No follow-ups on file.Marland Kitchen She was informed of the importance of frequent follow up visits to maximize her success with intensive lifestyle modifications for her multiple health conditions.  Attestation Statement   Reviewed by clinician on day of visit: allergies, medications, problem list, medical history, surgical history, family history, social history, and previous encounter notes.   I have spent 40 minutes in the care of the patient today including: preparing to see patient (e.g. review and interpretation of tests, old notes ), obtaining and/or reviewing separately obtained history, performing a medically appropriate examination or evaluation, counseling and educating the patient, ordering medications, test or  procedures, documenting clinical information in the electronic or other health care record, and independently interpreting results and communicating results to the patient, family, or caregiver   Worthy Rancher, MD

## 2023-11-25 ENCOUNTER — Ambulatory Visit (INDEPENDENT_AMBULATORY_CARE_PROVIDER_SITE_OTHER): Admitting: Internal Medicine

## 2024-01-15 ENCOUNTER — Ambulatory Visit (INDEPENDENT_AMBULATORY_CARE_PROVIDER_SITE_OTHER): Admitting: Internal Medicine

## 2024-01-15 ENCOUNTER — Encounter (INDEPENDENT_AMBULATORY_CARE_PROVIDER_SITE_OTHER): Payer: Self-pay | Admitting: Internal Medicine

## 2024-01-15 DIAGNOSIS — R7303 Prediabetes: Secondary | ICD-10-CM

## 2024-01-15 DIAGNOSIS — E66811 Obesity, class 1: Secondary | ICD-10-CM

## 2024-01-15 DIAGNOSIS — Z6830 Body mass index (BMI) 30.0-30.9, adult: Secondary | ICD-10-CM

## 2024-01-15 MED ORDER — ZEPBOUND 2.5 MG/0.5ML ~~LOC~~ SOLN: 2.5000 mg | SUBCUTANEOUS | 0 refills | Status: DC

## 2024-01-15 NOTE — Progress Notes (Signed)
 Office: [phone]  /  Fax: [phone]  Weight Summary and Body Composition Analysis (BIA)  Vitals Temp: 98.1 F (36.7 C) BP: 110/75 Pulse Rate: 80 SpO2: 100 %   Anthropometric Measurements Height: 5' (1.524 m) Weight: 156 lb (70.8 kg) BMI (Calculated): 30.47 Weight at Last Visit: 155 lb Weight Lost Since Last Visit: 0 lb Weight Gained Since Last Visit: 1 lb Starting Weight: 157 lb Total Weight Loss (lbs): 1 lb (0.454 kg) Peak Weight: 165 lb   Body Composition  Body Fat %: 34.3 % Fat Mass (lbs): 53.6 lbs Muscle Mass (lbs): 97.2 lbs Total Body Water (lbs): 64.2 lbs Visceral Fat Rating : 8    RMR: 1699  Today's Visit #: 9  Starting Date: 01/21/23   Subjective   Chief Complaint: Obesity  Interval History Discussed the use of AI scribe software for clinical note transcription with the patient, who gave verbal consent to proceed.  History of Present Illness   Madison Robinson is a 48 year old female who presents for medical weight management.  She has maintained her weight since her last visit around April 1st. She follows a 1200 calorie nutrition plan about 30% of the time, which she notes is decreased compliance. She is eating more whole foods but not getting the recommended amount of protein and denies skipping meals.  She exercises three days a week for about 20 minutes each session. Her body fat percentage is 34% with a goal of less than 34%, and her visceral fat rating is 8. She has been on phentermine  since January 2025. Topiramate  was added in the evening, but she did not notice any impact on her appetite.  Her BMI is 30. She briefly looked into the cost of GLP-1 medications but did not pursue it further. Her insurance coverage has not changed. She has previously taken B12 injections and is familiar with the process of administering injections.       Challenges affecting patient progress: strong hunger signals and/or impaired satiety /  inhibitory control.    Pharmacotherapy for weight management: She is currently taking Topiramate  (off label use, single agent) without clinical response and without side effects. and Phentermine  (longterm use, single agent)  without clinical response and without side effects..  Due to lack of response after 6 months she is considered a nonresponder therefore medications will be discontinued.  Assessment and Plan   Treatment Plan For Obesity:  Recommended Dietary Goals  Madison Robinson is currently in the action stage of change. As such, her goal is to continue weight management plan. She has agreed to: continue current plan  Behavioral Health and Counseling  We discussed the following behavioral modification strategies today: continue to work on maintaining a reduced calorie state, getting the recommended amount of protein, incorporating whole foods, making healthy choices, staying well hydrated and practicing mindfulness when eating..  Additional education and resources provided today: None  Recommended Physical Activity Goals  Madison Robinson has been advised to work up to 150 minutes of moderate intensity aerobic activity a week and strengthening exercises 2-3 times per week for cardiovascular health, weight loss maintenance and preservation of muscle mass.   She has agreed to :  Think about enjoyable ways to increase daily physical activity and overcoming barriers to exercise and Increase physical activity in their day and reduce sedentary time (increase NEAT).  Medical Interventions and Pharmacotherapy  We discussed various medication options to help Madison Robinson with her weight loss efforts and we both agreed to : Start anti-obesity  medication.  In addition to reduced calorie nutrition plan (RCNP), behavioral strategies and physical activity, Madison Robinson would benefit from pharmacotherapy to assist with hunger signals, satiety and cravings. This will reduce obesity-related health risks by inducing  weight loss, and help reduce food consumption and adherence to Madison Robinson) . It may also improve QOL by improving self-confidence and reduce the  setbacks associated with metabolic adaptations.  After discussion of treatment options, mechanisms of action, benefits, side effects, contraindications and shared decision making she is agreeable to starting Zepbound  2.5 mg once a week via [employer] direct. Patient also made aware that medication is indicated for long-term management of obesity and the risk of weight regain following discontinuation of treatment and hence the importance of adhering to medical weight loss plan.  We demonstrated use of device and patient using teach back method was able to demonstrate proper technique.  Associated Conditions Impacted by Obesity Treatment  Class 1 obesity with serious comorbidity and body mass index (BMI) of 30.0 to 30.9 in adult, unspecified obesity type  Prediabetes     Assessment and Plan    Obesity She has not achieved the expected 5% weight loss with phentermine  since January, indicating non-response. Her body fat percentage is 34%, with a visceral fat rating of 8. Despite a BMI of 30 her weight loss will be gradual. She follows a 1200 calorie diet with decreased adherence and exercises three days a week for 20 minutes. The combination of phentermine  and topiramate  (Qsymia) has been ineffective in appetite suppression.Zepbound , a GLP-1 receptor agonist, is recommended for its effectiveness in appetite suppression and potential weight loss. 80% of patients respond to low doses, and long-term use of at least two years is necessary to maintain weight loss. Side effects such as nausea, vomiting, diarrhea, and constipation are possible, especially with dietary indiscretions. - Discontinue phentermine , topiramate  and metformin . - Initiate Zepbound  at 2.5 mg weekly, with potential dose adjustments based on response and side effects. - Educate on potential side effects  of Zepbound , including nausea, vomiting, diarrhea, and constipation, and advise dietary modifications to mitigate these effects. - Advise on maintaining hydration with 90 ounces of water daily and ensuring adequate fiber intake from whole grains, fruits, and vegetables. - Emphasize the importance of protein intake to prevent muscle loss. - Encourage continuation of exercise regimen and consider increasing frequency for better results. - Discuss the need for long-term use of Zepbound  for at least two years to maintain weight loss. - Plan for follow-up in one month to assess response to medication and side effects.  Prediabetes  She is due for disease monitoring labs and will be coming in a fasting state at the next office visit.  We are discontinuing metformin  as she will be starting Zepbound  for weight management and pharmacoprophylaxis.   General Health Maintenance Advised to maintain a healthy lifestyle to support weight management and overall health. - Encourage adherence to a balanced diet with adequate protein and fiber. - Promote regular physical activity. - Advise on staying hydrated and avoiding greasy, spicy, and sugary foods to prevent medication side effects.  Follow-up Follow-up plans are discussed to monitor progress and response to the new medication regimen. - Schedule follow-up appointment in one month to evaluate the effectiveness and side effects of Zepbound . - Order updated labs in July, with options for fasting blood work at the clinic or [facility].        Objective   Physical Exam:  Blood pressure 110/75, pulse 80, temperature 98.1 F (36.7 C),  height 5' (1.524 m), weight 156 lb (70.8 kg), SpO2 100%. Body mass index is 30.47 kg/m.  General: She is overweight, cooperative, alert, well developed, and in no acute distress. PSYCH: Has normal mood, affect and thought process.   HEENT: EOMI, sclerae are anicteric. Lungs: Normal breathing effort, no conversational  dyspnea. Extremities: No edema.  Neurologic: No gross sensory or motor deficits. No tremors or fasciculations noted.    Diagnostic Data Reviewed:  BMET    Component Value Date/Time   NA 142 01/21/2023 0830   K 4.5 01/21/2023 0830   CL 104 01/21/2023 0830   CO2 25 01/21/2023 0830   GLUCOSE 81 01/21/2023 0830   GLUCOSE 82 07/29/2022 0734   BUN 12 01/21/2023 0830   CREATININE 0.66 01/21/2023 0830   CREATININE 0.61 06/19/2020 0921   CALCIUM 9.6 01/21/2023 0830   GFRNONAA 109 06/19/2020 0921   GFRAA 127 06/19/2020 0921   Lab Results  Component Value Date   HGBA1C 5.7 (H) 01/21/2023   Lab Results  Component Value Date   INSULIN  12.2 01/21/2023   Lab Results  Component Value Date   TSH 0.69 07/29/2022   CBC    Component Value Date/Time   WBC 5.3 01/21/2023 0830   WBC 3.3 (L) 12/10/2017 1141   RBC 4.35 01/21/2023 0830   RBC 4.36 12/10/2017 1141   HGB 12.8 01/21/2023 0830   HCT 38.5 01/21/2023 0830   PLT 204.0 12/10/2017 1141   MCV 89 01/21/2023 0830   MCH 29.4 01/21/2023 0830   MCH 25.7 (L) 03/17/2013 1030   MCHC 33.2 01/21/2023 0830   MCHC 34.0 12/10/2017 1141   RDW 13.1 01/21/2023 0830   Iron Studies No results found for: IRON, TIBC, FERRITIN, IRONPCTSAT Lipid Panel     Component Value Date/Time   CHOL 161 07/29/2022 0734   TRIG 78.0 07/29/2022 0734   HDL 46.20 07/29/2022 0734   CHOLHDL 3 07/29/2022 0734   VLDL 15.6 07/29/2022 0734   LDLCALC 100 (H) 07/29/2022 0734   LDLCALC 92 06/19/2020 0921   Hepatic Function Panel     Component Value Date/Time   PROT 7.5 01/21/2023 0830   ALBUMIN 4.4 01/21/2023 0830   AST 22 01/21/2023 0830   ALT 27 01/21/2023 0830   ALKPHOS 86 01/21/2023 0830   BILITOT 0.4 01/21/2023 0830      Component Value Date/Time   TSH 0.69 07/29/2022 0734   Nutritional Lab Results  Component Value Date   VD25OH 32.7 01/21/2023    Medications: Outpatient Encounter Medications as of 01/15/2024  Medication Sig    Cholecalciferol (VITAMIN D3) 50 MCG (2000 UT) capsule Take 1 capsule (2,000 Units total) by mouth daily.   hydrOXYzine  (ATARAX ) 25 MG tablet Take 1 tablet (25 mg total) by mouth every 12 (twelve) hours as needed for anxiety.   levonorgestrel (MIRENA) 20 MCG/24HR IUD Mirena 20 mcg/24 hours (5 yrs) 52 mg intrauterine device  Take 1 device by intrauterine route.   metFORMIN  (GLUCOPHAGE -XR) 500 MG 24 hr tablet Take 1 tablet (500 mg total) by mouth 2 (two) times daily with a meal.   phentermine  (ADIPEX-P ) 37.5 MG tablet Take 0.5 tablets (18.75 mg total) by mouth daily before breakfast.   sertraline  (ZOLOFT ) 50 MG tablet TAKE 1 TABLET BY MOUTH EVERY DAY   topiramate  (TOPAMAX ) 25 MG tablet Take 1 tablet (25 mg total) by mouth at bedtime.   No facility-administered encounter medications on file as of 01/15/2024.     Follow-Up   No follow-ups on file.SABRA  She was informed of the importance of frequent follow up visits to maximize her success with intensive lifestyle modifications for her multiple health conditions.  Attestation Statement   Reviewed by clinician on day of visit: allergies, medications, problem list, medical history, surgical history, family history, social history, and previous encounter notes.     Lucas Parker, MD

## 2024-02-05 ENCOUNTER — Other Ambulatory Visit (INDEPENDENT_AMBULATORY_CARE_PROVIDER_SITE_OTHER): Payer: Self-pay | Admitting: Internal Medicine

## 2024-02-05 DIAGNOSIS — E66811 Obesity, class 1: Secondary | ICD-10-CM

## 2024-02-05 DIAGNOSIS — R7303 Prediabetes: Secondary | ICD-10-CM

## 2024-02-24 ENCOUNTER — Encounter (INDEPENDENT_AMBULATORY_CARE_PROVIDER_SITE_OTHER): Payer: Self-pay | Admitting: Internal Medicine

## 2024-02-24 ENCOUNTER — Ambulatory Visit (INDEPENDENT_AMBULATORY_CARE_PROVIDER_SITE_OTHER): Admitting: Internal Medicine

## 2024-02-24 VITALS — BP 116/80 | HR 65 | Temp 98.0°F | Ht 60.0 in | Wt 156.0 lb

## 2024-02-24 DIAGNOSIS — E66811 Obesity, class 1: Secondary | ICD-10-CM | POA: Diagnosis not present

## 2024-02-24 DIAGNOSIS — Z6830 Body mass index (BMI) 30.0-30.9, adult: Secondary | ICD-10-CM

## 2024-02-24 DIAGNOSIS — R7303 Prediabetes: Secondary | ICD-10-CM

## 2024-02-24 MED ORDER — TIRZEPATIDE-WEIGHT MANAGEMENT 5 MG/0.5ML ~~LOC~~ SOLN: 5.0000 mg | SUBCUTANEOUS | 0 refills | Status: DC

## 2024-02-24 NOTE — Progress Notes (Signed)
 Office: [phone]  /  Fax: [phone]  Weight Summary and Body Composition Analysis (BIA)  Vitals Temp: 98 F (36.7 C) BP: 116/80 Pulse Rate: 65 SpO2: 98 %   Anthropometric Measurements Height: 5' (1.524 m) Weight: 156 lb (70.8 kg) BMI (Calculated): 30.47 Weight at Last Visit: 156 lb Weight Lost Since Last Visit: 0 lb Weight Gained Since Last Visit: 0 lb Starting Weight: 157 lb Total Weight Loss (lbs): 1 lb (0.454 kg) Peak Weight: 165   Body Composition  Body Fat %: 34.8 % Fat Mass (lbs): 54.4 lbs Muscle Mass (lbs): 97 lbs Total Body Water (lbs): 64.2 lbs Visceral Fat Rating : 8    RMR: 1699  Today's Visit #: 10  Starting Date: 01/21/23   Subjective   Chief Complaint: Obesity  Interval History Discussed the use of AI scribe software for clinical note transcription with the patient, who gave verbal consent to proceed.  History of Present Illness   Madison Robinson is a 48 year old female with obesity and prediabetes who presents for medical weight management.  She is on a 1200 calorie nutrition plan with fair adherence, focusing on consuming more whole foods and ensuring adequate protein intake. She does not skip meals and gets adequate sleep. She exercises three days a week for 20 minutes, focusing on cardio.  She was started on Zepbound  2.5 mg weekly since the last visit. She experiences a change in appetite and sense of fullness, although the effect diminishes by day four. No constipation, nausea, or vomiting. She reported experiencing discomfort after eating a particular food that did not agree with her, which prompted her to avoid similar foods.  She has a history of prediabetes and was previously on metformin , which has been discontinued. She occasionally uses Atarax  for itching and has not been taking Zoloft  regularly.       Challenges affecting patient progress: none.    Pharmacotherapy for weight management: She is currently taking  Zepbound  with adequate clinical response  and without side effects..   Assessment and Plan   Treatment Plan For Obesity:  Recommended Dietary Goals  Madison Robinson is currently in the action stage of change. As such, her goal is to continue weight management plan. She has agreed to: continue current plan  Behavioral Health and Counseling  We discussed the following behavioral modification strategies today: continue to work on maintaining a reduced calorie state, getting the recommended amount of protein, incorporating whole foods, making healthy choices, staying well hydrated and practicing mindfulness when eating..  Additional education and resources provided today: Handout on dumbbell exercises  Recommended Physical Activity Goals  Madison Robinson has been advised to work up to 150 minutes of moderate intensity aerobic activity a week and strengthening exercises 2-3 times per week for cardiovascular health, weight loss maintenance and preservation of muscle mass.   She has agreed to :  Increase the intensity, frequency or duration of strengthening exercises  and Increase the intensity, frequency or duration of aerobic exercises    Medical Interventions and Pharmacotherapy  We discussed various medication options to help Madison Robinson with her weight loss efforts and we both agreed to : Increase Zepbound  to 5 mg once a week.  We counseled patient on nutritional approaches to avoid adverse effects.  Associated Conditions Impacted by Obesity Treatment  Assessment & Plan Class 1 obesity with serious comorbidity and body mass index (BMI) of 30.0 to 30.9 in adult, unspecified obesity type Obesity with prediabetes Obesity with prediabetes, currently on 2.5 mg of  Zepbound  with no significant weight loss. She adheres to a 1200 calorie diet and regular exercise. No side effects such as nausea or constipation reported. Appetite changes noted with medication wearing off by day 4. - Increase Zepbound  to 5 mg  weekly to enhance weight loss. - Stop metformin , Topamax , and phentermine  as Zepbound  is expected to manage prediabetes and aid in weight loss. - Order fasting blood glucose, hemoglobin A1c, and insulin  levels. - Encourage maintaining a 1200 calorie diet with adequate protein intake. - Recommend incorporating strengthening exercises, such as yoga or circuit training, at home to preserve muscle mass. - Discussed outcome statistics: 16% weight loss expected at 5 mg dose, 21-22% at 10-15 mg doses. - Emphasized the importance of gradual weight loss to prevent muscle loss and ensure sustainability. - Schedule follow-up in four weeks to assess response to increased Zepbound  dose. Prediabetes We are checking disease monitoring labs please refer to orders                Objective   Physical Exam:  Blood pressure 116/80, pulse 65, temperature 98 F (36.7 C), height 5' (1.524 m), weight 156 lb (70.8 kg), SpO2 98%. Body mass index is 30.47 kg/m.  General: She is overweight, cooperative, alert, well developed, and in no acute distress. PSYCH: Has normal mood, affect and thought process.   HEENT: EOMI, sclerae are anicteric. Lungs: Normal breathing effort, no conversational dyspnea. Extremities: No edema.  Neurologic: No gross sensory or motor deficits. No tremors or fasciculations noted.    Diagnostic Data Reviewed:  BMET    Component Value Date/Time   NA 142 01/21/2023 0830   K 4.5 01/21/2023 0830   CL 104 01/21/2023 0830   CO2 25 01/21/2023 0830   GLUCOSE 81 01/21/2023 0830   GLUCOSE 82 07/29/2022 0734   BUN 12 01/21/2023 0830   CREATININE 0.66 01/21/2023 0830   CREATININE 0.61 06/19/2020 0921   CALCIUM 9.6 01/21/2023 0830   GFRNONAA 109 06/19/2020 0921   GFRAA 127 06/19/2020 0921   Lab Results  Component Value Date   HGBA1C 5.7 (H) 01/21/2023   Lab Results  Component Value Date   INSULIN  12.2 01/21/2023   Lab Results  Component Value Date   TSH 0.69 07/29/2022    CBC    Component Value Date/Time   WBC 5.3 01/21/2023 0830   WBC 3.3 (L) 12/10/2017 1141   RBC 4.35 01/21/2023 0830   RBC 4.36 12/10/2017 1141   HGB 12.8 01/21/2023 0830   HCT 38.5 01/21/2023 0830   PLT 204.0 12/10/2017 1141   MCV 89 01/21/2023 0830   MCH 29.4 01/21/2023 0830   MCH 25.7 (L) 03/17/2013 1030   MCHC 33.2 01/21/2023 0830   MCHC 34.0 12/10/2017 1141   RDW 13.1 01/21/2023 0830   Iron Studies No results found for: IRON, TIBC, FERRITIN, IRONPCTSAT Lipid Panel     Component Value Date/Time   CHOL 161 07/29/2022 0734   TRIG 78.0 07/29/2022 0734   HDL 46.20 07/29/2022 0734   CHOLHDL 3 07/29/2022 0734   VLDL 15.6 07/29/2022 0734   LDLCALC 100 (H) 07/29/2022 0734   LDLCALC 92 06/19/2020 0921   Hepatic Function Panel     Component Value Date/Time   PROT 7.5 01/21/2023 0830   ALBUMIN 4.4 01/21/2023 0830   AST 22 01/21/2023 0830   ALT 27 01/21/2023 0830   ALKPHOS 86 01/21/2023 0830   BILITOT 0.4 01/21/2023 0830      Component Value Date/Time   TSH 0.69 07/29/2022  9265   Nutritional Lab Results  Component Value Date   VD25OH 32.7 01/21/2023    Medications: Outpatient Encounter Medications as of 02/24/2024  Medication Sig   Cholecalciferol (VITAMIN D3) 50 MCG (2000 UT) capsule Take 1 capsule (2,000 Units total) by mouth daily.   hydrOXYzine  (ATARAX ) 25 MG tablet Take 1 tablet (25 mg total) by mouth every 12 (twelve) hours as needed for anxiety.   levonorgestrel (MIRENA) 20 MCG/24HR IUD Mirena 20 mcg/24 hours (5 yrs) 52 mg intrauterine device  Take 1 device by intrauterine route.   sertraline  (ZOLOFT ) 50 MG tablet TAKE 1 TABLET BY MOUTH EVERY DAY   tirzepatide  5 MG/0.5ML injection vial Inject 5 mg into the skin once a week.   [DISCONTINUED] metFORMIN  (GLUCOPHAGE -XR) 500 MG 24 hr tablet Take 1 tablet (500 mg total) by mouth 2 (two) times daily with a meal.   [DISCONTINUED] phentermine  (ADIPEX-P ) 37.5 MG tablet Take 0.5 tablets (18.75 mg total) by  mouth daily before breakfast.   [DISCONTINUED] tirzepatide  (ZEPBOUND ) 2.5 MG/0.5ML injection vial Inject 2.5 mg into the skin once a week.   [DISCONTINUED] topiramate  (TOPAMAX ) 25 MG tablet Take 1 tablet (25 mg total) by mouth at bedtime.   No facility-administered encounter medications on file as of 02/24/2024.     Follow-Up   No follow-ups on file.SABRA She was informed of the importance of frequent follow up visits to maximize her success with intensive lifestyle modifications for her multiple health conditions.  Attestation Statement   Reviewed by clinician on day of visit: allergies, medications, problem list, medical history, surgical history, family history, social history, and previous encounter notes.     Lucas Parker, MD

## 2024-02-24 NOTE — Assessment & Plan Note (Signed)
 Obesity with prediabetes Obesity with prediabetes, currently on 2.5 mg of Zepbound  with no significant weight loss. She adheres to a 1200 calorie diet and regular exercise. No side effects such as nausea or constipation reported. Appetite changes noted with medication wearing off by day 4. - Increase Zepbound  to 5 mg weekly to enhance weight loss. - Stop metformin , Topamax , and phentermine  as Zepbound  is expected to manage prediabetes and aid in weight loss. - Order fasting blood glucose, hemoglobin A1c, and insulin  levels. - Encourage maintaining a 1200 calorie diet with adequate protein intake. - Recommend incorporating strengthening exercises, such as yoga or circuit training, at home to preserve muscle mass. - Discussed outcome statistics: 16% weight loss expected at 5 mg dose, 21-22% at 10-15 mg doses. - Emphasized the importance of gradual weight loss to prevent muscle loss and ensure sustainability. - Schedule follow-up in four weeks to assess response to increased Zepbound  dose.

## 2024-02-24 NOTE — Assessment & Plan Note (Signed)
 We are checking disease monitoring labs please refer to orders

## 2024-02-25 LAB — INSULIN, RANDOM: INSULIN: 10.5 u[IU]/mL (ref 2.6–24.9)

## 2024-02-25 LAB — LIPID PANEL WITH LDL/HDL RATIO
Cholesterol, Total: 160 mg/dL (ref 100–199)
HDL: 43 mg/dL (ref >39)
LDL Chol Calc (NIH): 97 mg/dL (ref 0–99)
LDL/HDL Ratio: 2.3 ratio (ref 0.0–3.2)
Triglycerides: 112 mg/dL (ref 0–149)
VLDL Cholesterol Cal: 20 mg/dL (ref 5–40)

## 2024-02-25 LAB — CMP14+EGFR
ALT: 21 IU/L (ref 0–32)
AST: 20 IU/L (ref 0–40)
Albumin: 4.4 g/dL (ref 3.9–4.9)
Alkaline Phosphatase: 70 IU/L (ref 44–121)
BUN/Creatinine Ratio: 17 (ref 9–23)
BUN: 11 mg/dL (ref 6–24)
Bilirubin Total: 0.6 mg/dL (ref 0.0–1.2)
CO2: 22 mmol/L (ref 20–29)
Calcium: 9.5 mg/dL (ref 8.7–10.2)
Chloride: 101 mmol/L (ref 96–106)
Creatinine, Ser: 0.65 mg/dL (ref 0.57–1.00)
Globulin, Total: 2.5 g/dL (ref 1.5–4.5)
Glucose: 72 mg/dL (ref 70–99)
Potassium: 4.1 mmol/L (ref 3.5–5.2)
Sodium: 138 mmol/L (ref 134–144)
Total Protein: 6.9 g/dL (ref 6.0–8.5)
eGFR: 108 mL/min/1.73 (ref >59)

## 2024-02-25 LAB — VITAMIN B12: Vitamin B-12: 500 pg/mL (ref 232–1245)

## 2024-02-25 LAB — HEMOGLOBIN A1C
Est. average glucose Bld gHb Est-mCnc: 114 mg/dL
Hgb A1c MFr Bld: 5.6 % (ref 4.8–5.6)

## 2024-02-26 ENCOUNTER — Ambulatory Visit (INDEPENDENT_AMBULATORY_CARE_PROVIDER_SITE_OTHER): Payer: Self-pay | Admitting: Internal Medicine

## 2024-03-25 ENCOUNTER — Ambulatory Visit (INDEPENDENT_AMBULATORY_CARE_PROVIDER_SITE_OTHER): Admitting: Internal Medicine

## 2024-03-26 ENCOUNTER — Other Ambulatory Visit (INDEPENDENT_AMBULATORY_CARE_PROVIDER_SITE_OTHER): Payer: Self-pay | Admitting: Internal Medicine

## 2024-03-26 DIAGNOSIS — Z6830 Body mass index (BMI) 30.0-30.9, adult: Secondary | ICD-10-CM

## 2024-03-26 DIAGNOSIS — R7303 Prediabetes: Secondary | ICD-10-CM

## 2024-03-30 ENCOUNTER — Encounter (INDEPENDENT_AMBULATORY_CARE_PROVIDER_SITE_OTHER): Payer: Self-pay | Admitting: Internal Medicine

## 2024-03-30 ENCOUNTER — Ambulatory Visit (INDEPENDENT_AMBULATORY_CARE_PROVIDER_SITE_OTHER): Admitting: Internal Medicine

## 2024-03-30 DIAGNOSIS — E66811 Obesity, class 1: Secondary | ICD-10-CM

## 2024-03-30 DIAGNOSIS — Z6830 Body mass index (BMI) 30.0-30.9, adult: Secondary | ICD-10-CM

## 2024-03-30 DIAGNOSIS — R7303 Prediabetes: Secondary | ICD-10-CM | POA: Diagnosis not present

## 2024-03-30 MED ORDER — TIRZEPATIDE-WEIGHT MANAGEMENT 5 MG/0.5ML ~~LOC~~ SOLN: 5.0000 mg | SUBCUTANEOUS | 0 refills | Status: DC

## 2024-03-30 NOTE — Progress Notes (Signed)
 Office: [phone]  /  Fax: [phone]  Weight Summary and Body Composition Analysis (BIA)  Vitals Temp: 98.2 F (36.8 C) BP: 114/74 Pulse Rate: 63 SpO2: 98 %   Anthropometric Measurements Height: 5' (1.524 m) Weight: 152 lb (68.9 kg) BMI (Calculated): 29.69 Weight at Last Visit: 156 lb Weight Lost Since Last Visit: 4 lb Weight Gained Since Last Visit: 0 lb Starting Weight: 157 lb Total Weight Loss (lbs): 5 lb (2.268 kg) Peak Weight: 165 lb   Body Composition  Body Fat %: 34.3 % Fat Mass (lbs): 52.2 lbs Muscle Mass (lbs): 95 lbs Total Body Water (lbs): 62.2 lbs Visceral Fat Rating : 8    RMR: 1699  Today's Visit #: 11  Starting Date: 01/21/23   Subjective   Chief Complaint: Obesity  Interval History  Discussed the use of AI scribe software for clinical note transcription with the patient, who gave verbal consent to proceed.  History of Present Illness   Madison Robinson is a 48 year old female who presents for medical weight management.  She is following a 1200-calorie nutrition plan about 70% of the time she is not tracking she reports eating more whole foods getting the recommended amount of protein maintaining adequate hydration denies skipping meals she is exercising 3 days a week about 20 minutes doing cardio.  She is now on Zepbound  5 mg once a week tolerating medication well she has lost 5 pounds over side effects.   She experiences a significant decrease in appetite and portion sizes, often leaving food behind. She also notes a reduction in 'food noise' and cravings for sweets, which were previously challenging for her.  She has an increased urge to hydrate and drink more water. No fatigue, constipation, nausea, or vomiting. She has lost five pounds over the course of the treatment.  She is currently receiving her medication from [facility] and is on a 5 mg weekly dose.           Challenges affecting patient progress: strong hunger signals  and/or impaired satiety / inhibitory control and lack of strengthening exercise.    Pharmacotherapy for weight management: She is currently taking Zepbound  with adequate clinical response  and without side effects..   Assessment and Plan   Treatment Plan For Obesity:  Recommended Dietary Goals  Madison Robinson is currently in the action stage of change. As such, her goal is to continue weight management plan. She has agreed to: continue current plan  Behavioral Health and Counseling  We discussed the following behavioral modification strategies today: continue to work on maintaining a reduced calorie state, getting the recommended amount of protein, incorporating whole foods, making healthy choices, staying well hydrated and practicing mindfulness when eating. and increase protein intake, fibrous foods (25 grams per day for women, 30 grams for men) and water to improve satiety and decrease hunger signals. .  Additional education and resources provided today: None  Recommended Physical Activity Goals  Madison Robinson has been advised to work up to 150 minutes of moderate intensity aerobic activity a week and strengthening exercises 2-3 times per week for cardiovascular health, weight loss maintenance and preservation of muscle mass.  She has agreed to :  Increase volume of physical activity to a goal of 240 minutes a week and Combine aerobic and strengthening exercises for efficiency and improved cardiometabolic health.  Medical Interventions and Pharmacotherapy  We discussed various medication options to help Madison Robinson with her weight loss efforts and we both agreed to : Adequate clinical  response to anti-obesity medication, continue current regimen and do not recommend further increases in GLP-1 due to adequate clinical response   Associated Conditions Impacted by Obesity Treatment  Assessment & Plan Class 1 obesity with serious comorbidity and body mass index (BMI) of 30.0 to 30.9 in adult,  unspecified obesity type She has been on a GLP-1 receptor agonist for four weeks at 5 mg, following an initial four weeks at 2.5 mg. She reports reduced appetite and cravings, particularly for sweets, and a weight loss of 5 pounds. Muscle mass is well-maintained. The current dose is effective with no significant side effects, although she experienced a headache after the second week of the 5 mg dose, which is not a common side effect. Discussed medication cost and potential side effects at higher doses, including nausea, constipation, and muscle loss. Emphasized dietary habits, including high fiber and protein intake, to maintain satiety and prevent constipation. Exercise is critical for weight maintenance and overall health. Encouraged strategic thinking about long-term goals, including potential dose reduction for maintenance. - Continue GLP-1 receptor agonist at 5 mg for four more weeks - Encourage high fiber and protein diet - Advise regular physical activity, aiming for 240 minutes per week - Discuss potential future reduction of medication dose for maintenance Prediabetes Reviewed most recent labs her fasting blood sugar was normal.  Her hemoglobin A1c has improved to 5.6 and her insulin  levels are at 10.  She is currently on Zepbound  for pharmacoprophylaxis and will continue along with nutrition and behavioral strategies.             Objective   Physical Exam:  Blood pressure 114/74, pulse 63, temperature 98.2 F (36.8 C), height 5' (1.524 m), weight 152 lb (68.9 kg), SpO2 98%. Body mass index is 29.69 kg/m.  General: She is overweight, cooperative, alert, well developed, and in no acute distress. PSYCH: Has normal mood, affect and thought process.   HEENT: EOMI, sclerae are anicteric. Lungs: Normal breathing effort, no conversational dyspnea. Extremities: No edema.  Neurologic: No gross sensory or motor deficits. No tremors or fasciculations noted.    Diagnostic Data  Reviewed:  BMET    Component Value Date/Time   NA 138 02/24/2024 0824   K 4.1 02/24/2024 0824   CL 101 02/24/2024 0824   CO2 22 02/24/2024 0824   GLUCOSE 72 02/24/2024 0824   GLUCOSE 82 07/29/2022 0734   BUN 11 02/24/2024 0824   CREATININE 0.65 02/24/2024 0824   CREATININE 0.61 06/19/2020 0921   CALCIUM 9.5 02/24/2024 0824   GFRNONAA 109 06/19/2020 0921   GFRAA 127 06/19/2020 0921   Lab Results  Component Value Date   HGBA1C 5.6 02/24/2024   HGBA1C 5.7 (H) 01/21/2023   Lab Results  Component Value Date   INSULIN  10.5 02/24/2024   INSULIN  12.2 01/21/2023   Lab Results  Component Value Date   TSH 0.69 07/29/2022   CBC    Component Value Date/Time   WBC 5.3 01/21/2023 0830   WBC 3.3 (L) 12/10/2017 1141   RBC 4.35 01/21/2023 0830   RBC 4.36 12/10/2017 1141   HGB 12.8 01/21/2023 0830   HCT 38.5 01/21/2023 0830   PLT 204.0 12/10/2017 1141   MCV 89 01/21/2023 0830   MCH 29.4 01/21/2023 0830   MCH 25.7 (L) 03/17/2013 1030   MCHC 33.2 01/21/2023 0830   MCHC 34.0 12/10/2017 1141   RDW 13.1 01/21/2023 0830   Iron Studies No results found for: IRON, TIBC, FERRITIN, IRONPCTSAT Lipid Panel  Component Value Date/Time   CHOL 160 02/24/2024 0824   TRIG 112 02/24/2024 0824   HDL 43 02/24/2024 0824   CHOLHDL 3 07/29/2022 0734   VLDL 15.6 07/29/2022 0734   LDLCALC 97 02/24/2024 0824   LDLCALC 92 06/19/2020 0921   Hepatic Function Panel     Component Value Date/Time   PROT 6.9 02/24/2024 0824   ALBUMIN 4.4 02/24/2024 0824   AST 20 02/24/2024 0824   ALT 21 02/24/2024 0824   ALKPHOS 70 02/24/2024 0824   BILITOT 0.6 02/24/2024 0824      Component Value Date/Time   TSH 0.69 07/29/2022 0734   Nutritional Lab Results  Component Value Date   VD25OH 32.7 01/21/2023    Medications: Outpatient Encounter Medications as of 03/30/2024  Medication Sig   Cholecalciferol (VITAMIN D3) 50 MCG (2000 UT) capsule Take 1 capsule (2,000 Units total) by mouth daily.    hydrOXYzine  (ATARAX ) 25 MG tablet Take 1 tablet (25 mg total) by mouth every 12 (twelve) hours as needed for anxiety.   levonorgestrel (MIRENA) 20 MCG/24HR IUD Mirena 20 mcg/24 hours (5 yrs) 52 mg intrauterine device  Take 1 device by intrauterine route.   sertraline  (ZOLOFT ) 50 MG tablet TAKE 1 TABLET BY MOUTH EVERY DAY   [DISCONTINUED] tirzepatide  5 MG/0.5ML injection vial Inject 5 mg into the skin once a week.   tirzepatide  5 MG/0.5ML injection vial Inject 5 mg into the skin once a week.   No facility-administered encounter medications on file as of 03/30/2024.     Follow-Up   Return in about 4 weeks (around 04/27/2024) for For Weight Mangement with Dr. Francyne.SABRA She was informed of the importance of frequent follow up visits to maximize her success with intensive lifestyle modifications for her multiple health conditions.  Attestation Statement   Reviewed by clinician on day of visit: allergies, medications, problem list, medical history, surgical history, family history, social history, and previous encounter notes.     Lucas Francyne, MD

## 2024-03-30 NOTE — Assessment & Plan Note (Signed)
 She has been on a GLP-1 receptor agonist for four weeks at 5 mg, following an initial four weeks at 2.5 mg. She reports reduced appetite and cravings, particularly for sweets, and a weight loss of 5 pounds. Muscle mass is well-maintained. The current dose is effective with no significant side effects, although she experienced a headache after the second week of the 5 mg dose, which is not a common side effect. Discussed medication cost and potential side effects at higher doses, including nausea, constipation, and muscle loss. Emphasized dietary habits, including high fiber and protein intake, to maintain satiety and prevent constipation. Exercise is critical for weight maintenance and overall health. Encouraged strategic thinking about long-term goals, including potential dose reduction for maintenance. - Continue GLP-1 receptor agonist at 5 mg for four more weeks - Encourage high fiber and protein diet - Advise regular physical activity, aiming for 240 minutes per week - Discuss potential future reduction of medication dose for maintenance

## 2024-03-30 NOTE — Assessment & Plan Note (Signed)
 Reviewed most recent labs her fasting blood sugar was normal.  Her hemoglobin A1c has improved to 5.6 and her insulin  levels are at 10.  She is currently on Zepbound  for pharmacoprophylaxis and will continue along with nutrition and behavioral strategies.

## 2024-04-27 ENCOUNTER — Ambulatory Visit (INDEPENDENT_AMBULATORY_CARE_PROVIDER_SITE_OTHER): Admitting: Internal Medicine

## 2024-04-27 ENCOUNTER — Encounter (INDEPENDENT_AMBULATORY_CARE_PROVIDER_SITE_OTHER): Payer: Self-pay | Admitting: Internal Medicine

## 2024-04-27 DIAGNOSIS — E66811 Obesity, class 1: Secondary | ICD-10-CM | POA: Diagnosis not present

## 2024-04-27 DIAGNOSIS — Z6830 Body mass index (BMI) 30.0-30.9, adult: Secondary | ICD-10-CM

## 2024-04-27 DIAGNOSIS — R7303 Prediabetes: Secondary | ICD-10-CM | POA: Diagnosis not present

## 2024-04-27 MED ORDER — TIRZEPATIDE-WEIGHT MANAGEMENT 5 MG/0.5ML ~~LOC~~ SOLN: 5.0000 mg | SUBCUTANEOUS | 1 refills | Status: DC

## 2024-04-27 MED ORDER — TIRZEPATIDE-WEIGHT MANAGEMENT 5 MG/0.5ML ~~LOC~~ SOLN
5.0000 mg | SUBCUTANEOUS | 0 refills | Status: DC
Start: 2024-04-27 — End: 2024-04-27

## 2024-04-27 NOTE — Progress Notes (Signed)
 Office: [phone]  /  Fax: [phone]  Weight Summary and Body Composition Analysis (BIA)  Vitals Temp: 98.1 F (36.7 C) BP: 120/72 Pulse Rate: 84 SpO2: 97 %   Anthropometric Measurements Height: 5' (1.524 m) Weight: 145 lb (65.8 kg) BMI (Calculated): 28.32 Weight at Last Visit: 152 lb Weight Lost Since Last Visit: 7 lb Weight Gained Since Last Visit: 0 lb Starting Weight: 157 lb Total Weight Loss (lbs): 12 lb (5.443 kg) Peak Weight: 165 lb   Body Composition  Body Fat %: 33.6 % Fat Mass (lbs): 49 lbs Muscle Mass (lbs): 91.6 lbs Total Body Water (lbs): 61.2 lbs Visceral Fat Rating : 7    RMR: 1699  Today's Visit #: 12  Starting Date: 01/21/23   Subjective   Chief Complaint: Obesity  Interval History Discussed the use of AI scribe software for clinical note transcription with the patient, who gave verbal consent to proceed.  History of Present Illness Madison Robinson is a 48 year old female who presents for medical weight management.   She is on Zepbound  5 mg once a week for weight management and has lost seven pounds over the past two months. Her clothes fit better, and she can wear some of her old wardrobe items again.  She follows a 1200 calorie nutrition plan and engages in physical activity three days a week, performing 20 minutes of cardio each session. She has purchased weights to incorporate strength training into her routine and has noticed a loss of size in her arms.  She experiences mild headaches on the first day of taking her medication, which she manages by taking the medication at night. She also experienced mild nausea after consuming alcohol at a wedding, which she attributes to the interaction with her medication. She has noticed that alcohol and spicy foods can exacerbate her symptoms when taking the medication.  She is satisfied with the medication's ability to curb her appetite, allowing her to feel hungry and eat in  controlled portions. She appreciates that the medication does not completely suppress her appetite, enabling her to enjoy meals.     Challenges affecting patient progress: none.    Pharmacotherapy for weight management: She is currently taking Zepbound  with adequate clinical response  and without side effects..   Assessment and Plan   Treatment Plan For Obesity:  Recommended Dietary Goals  Madison Robinson is currently in the action stage of change. As such, her goal is to continue weight management plan. She has agreed to: incorporate 1-2 meal replacements a day for convenience  and continue current plan  Behavioral Health and Counseling  We discussed the following behavioral modification strategies today: continue to work on maintaining a reduced calorie state, getting the recommended amount of protein, incorporating whole foods, making healthy choices, staying well hydrated and practicing mindfulness when eating. and increase protein intake, fibrous foods (25 grams per day for women, 30 grams for men) and water to improve satiety and decrease hunger signals. .  Additional education and resources provided today: None  Recommended Physical Activity Goals  Madison Robinson has been advised to work up to 150 minutes of moderate intensity aerobic activity a week and strengthening exercises 2-3 times per week for cardiovascular health, weight loss maintenance and preservation of muscle mass.  She has agreed to :  Increase physical activity in their day and reduce sedentary time (increase NEAT)., Increase volume of physical activity to a goal of 240 minutes a week, and Combine aerobic and strengthening exercises for efficiency  and improved cardiometabolic health.  Medical Interventions and Pharmacotherapy  We discussed various medication options to help Madison Robinson with her weight loss efforts and we both agreed to : Adequate clinical response to anti-obesity medication, continue current regimen and do not  recommend further increases in GLP-1 due to adequate clinical response   Associated Conditions Impacted by Obesity Treatment  Assessment & Plan Class 1 obesity with serious comorbidity and body mass index (BMI) of 30.0 to 30.9 in adult, unspecified obesity type Weight: decrease of 19 lb (11.6%) over 1 year, 5 months  Start: 11/27/2022 164 lb (74.4 kg)  End: 04/27/2024 145 lb (65.8 kg)   Madison Robinson has lost 7 pounds over the past two months on Zepbound  5 mg weekly and a 1200 calorie nutrition plan. Her body fat percentage has reduced to 33%. Her goal is a body fat percentage between 23% and 34% and a weight of 130 pounds. She engages in cardio exercises three times a week for 20 minutes and plans to start strength training to prevent further muscle loss. - Continue semaglutide 5 mg weekly - Maintain 1200 calorie diet with 90 grams of protein daily - Increase exercise to 240 minutes per week, including strength training - Reassess weight and body composition in 6 weeks - Consider lower dose of Zepbound  to maintain weight loss and reduce costs once goal is reached - Some loss of muscle mass noted.  Again emphasized the importance of maintaining adequate protein intake and strengthening consider reduction in GLP-1 if ongoing. Prediabetes Reviewed most recent labs her fasting blood sugar was normal.  Her hemoglobin A1c has improved to 5.6 and her insulin  levels are at 10.  She is currently on Zepbound  for pharmacoprophylaxis and will continue along with nutrition and behavioral strategies.          Objective   Physical Exam:  Blood pressure 120/72, pulse 84, temperature 98.1 F (36.7 C), height 5' (1.524 m), weight 145 lb (65.8 kg), SpO2 97%. Body mass index is 28.32 kg/m.  General: She is overweight, cooperative, alert, well developed, and in no acute distress. PSYCH: Has normal mood, affect and thought process.   HEENT: EOMI, sclerae are anicteric. Lungs: Normal breathing effort, no  conversational dyspnea. Extremities: No edema.  Neurologic: No gross sensory or motor deficits. No tremors or fasciculations noted.    Diagnostic Data Reviewed:  BMET    Component Value Date/Time   NA 138 02/24/2024 0824   K 4.1 02/24/2024 0824   CL 101 02/24/2024 0824   CO2 22 02/24/2024 0824   GLUCOSE 72 02/24/2024 0824   GLUCOSE 82 07/29/2022 0734   BUN 11 02/24/2024 0824   CREATININE 0.65 02/24/2024 0824   CREATININE 0.61 06/19/2020 0921   CALCIUM 9.5 02/24/2024 0824   GFRNONAA 109 06/19/2020 0921   GFRAA 127 06/19/2020 0921   Lab Results  Component Value Date   HGBA1C 5.6 02/24/2024   HGBA1C 5.7 (H) 01/21/2023   Lab Results  Component Value Date   INSULIN  10.5 02/24/2024   INSULIN  12.2 01/21/2023   Lab Results  Component Value Date   TSH 0.69 07/29/2022   CBC    Component Value Date/Time   WBC 5.3 01/21/2023 0830   WBC 3.3 (L) 12/10/2017 1141   RBC 4.35 01/21/2023 0830   RBC 4.36 12/10/2017 1141   HGB 12.8 01/21/2023 0830   HCT 38.5 01/21/2023 0830   PLT 204.0 12/10/2017 1141   MCV 89 01/21/2023 0830   MCH 29.4 01/21/2023 0830   MCH  25.7 (L) 03/17/2013 1030   MCHC 33.2 01/21/2023 0830   MCHC 34.0 12/10/2017 1141   RDW 13.1 01/21/2023 0830   Iron Studies No results found for: IRON, TIBC, FERRITIN, IRONPCTSAT Lipid Panel     Component Value Date/Time   CHOL 160 02/24/2024 0824   TRIG 112 02/24/2024 0824   HDL 43 02/24/2024 0824   CHOLHDL 3 07/29/2022 0734   VLDL 15.6 07/29/2022 0734   LDLCALC 97 02/24/2024 0824   LDLCALC 92 06/19/2020 0921   Hepatic Function Panel     Component Value Date/Time   PROT 6.9 02/24/2024 0824   ALBUMIN 4.4 02/24/2024 0824   AST 20 02/24/2024 0824   ALT 21 02/24/2024 0824   ALKPHOS 70 02/24/2024 0824   BILITOT 0.6 02/24/2024 0824      Component Value Date/Time   TSH 0.69 07/29/2022 0734   Nutritional Lab Results  Component Value Date   VD25OH 32.7 01/21/2023    Medications: Outpatient  Encounter Medications as of 04/27/2024  Medication Sig   Cholecalciferol (VITAMIN D3) 50 MCG (2000 UT) capsule Take 1 capsule (2,000 Units total) by mouth daily.   hydrOXYzine  (ATARAX ) 25 MG tablet Take 1 tablet (25 mg total) by mouth every 12 (twelve) hours as needed for anxiety.   levonorgestrel (MIRENA) 20 MCG/24HR IUD Mirena 20 mcg/24 hours (5 yrs) 52 mg intrauterine device  Take 1 device by intrauterine route.   sertraline  (ZOLOFT ) 50 MG tablet TAKE 1 TABLET BY MOUTH EVERY DAY   tirzepatide  5 MG/0.5ML injection vial Inject 5 mg into the skin once a week.   [DISCONTINUED] tirzepatide  5 MG/0.5ML injection vial Inject 5 mg into the skin once a week.   [DISCONTINUED] tirzepatide  5 MG/0.5ML injection vial Inject 5 mg into the skin once a week.   No facility-administered encounter medications on file as of 04/27/2024.     Follow-Up   Return in about 6 weeks (around 06/08/2024).SABRA She was informed of the importance of frequent follow up visits to maximize her success with intensive lifestyle modifications for her multiple health conditions.  Attestation Statement   Reviewed by clinician on day of visit: allergies, medications, problem list, medical history, surgical history, family history, social history, and previous encounter notes.     Lucas Parker, MD

## 2024-04-28 NOTE — Assessment & Plan Note (Signed)
 Weight: decrease of 19 lb (11.6%) over 1 year, 5 months  Start: 11/27/2022 164 lb (74.4 kg)  End: 04/27/2024 145 lb (65.8 kg)   Madison Robinson has lost 7 pounds over the past two months on Zepbound  5 mg weekly and a 1200 calorie nutrition plan. Her body fat percentage has reduced to 33%. Her goal is a body fat percentage between 23% and 34% and a weight of 130 pounds. She engages in cardio exercises three times a week for 20 minutes and plans to start strength training to prevent further muscle loss. - Continue semaglutide 5 mg weekly - Maintain 1200 calorie diet with 90 grams of protein daily - Increase exercise to 240 minutes per week, including strength training - Reassess weight and body composition in 6 weeks - Consider lower dose of Zepbound  to maintain weight loss and reduce costs once goal is reached - Some loss of muscle mass noted.  Again emphasized the importance of maintaining adequate protein intake and strengthening consider reduction in GLP-1 if ongoing.

## 2024-04-28 NOTE — Assessment & Plan Note (Signed)
 Reviewed most recent labs her fasting blood sugar was normal.  Her hemoglobin A1c has improved to 5.6 and her insulin  levels are at 10.  She is currently on Zepbound  for pharmacoprophylaxis and will continue along with nutrition and behavioral strategies.

## 2024-06-09 ENCOUNTER — Encounter (INDEPENDENT_AMBULATORY_CARE_PROVIDER_SITE_OTHER): Payer: Self-pay | Admitting: Internal Medicine

## 2024-06-09 ENCOUNTER — Ambulatory Visit (INDEPENDENT_AMBULATORY_CARE_PROVIDER_SITE_OTHER): Payer: Self-pay | Admitting: Internal Medicine

## 2024-06-09 VITALS — BP 114/77 | HR 75 | Temp 98.0°F | Ht 60.0 in | Wt 146.0 lb

## 2024-06-09 DIAGNOSIS — Z6828 Body mass index (BMI) 28.0-28.9, adult: Secondary | ICD-10-CM | POA: Diagnosis not present

## 2024-06-09 DIAGNOSIS — E669 Obesity, unspecified: Secondary | ICD-10-CM | POA: Diagnosis not present

## 2024-06-09 DIAGNOSIS — R7303 Prediabetes: Secondary | ICD-10-CM | POA: Diagnosis not present

## 2024-06-09 NOTE — Assessment & Plan Note (Signed)
 Weight: decrease of 18 lb (11%) over 1 year, 6 months  Start: 11/27/2022 164 lb (74.4 kg)  End: 06/09/2024 146 lb (66.2 kg)  Management with Zepbound  5 mg weekly. Reports better efficacy with 5 mg dose compared to 2.5 mg. Experienced wearing off effect when extending dose to every other week, leading to slight weight gain and increased muscle mass. Body fat percentage reduced to 32%. No significant nausea, only mild queasiness initially. Discussed potential switch to generic Victoza for cost savings and flexibility in dosing. Current goal is to maintain weight and potentially lose additional 10 pounds. Discussed potential cost savings and flexibility with Victoza, which requires daily injections but allows for dose adjustments without refill constraints. - Continue Zepbound  5 mg weekly until current prescription is exhausted. - Consider transitioning to generic Victoza in January for cost savings and dosing flexibility. - Encouraged maintaining physical activity with a goal of 5,000 to 10,000 steps daily and 240 minutes of exercise weekly. - Will schedule follow-up appointment in early January.Reviewed most recent labs her fasting blood sugar was normal.  Her hemoglobin A1c has improved to 5.6 and her insulin  levels are at 10.  She is currently on Zepbound  for pharmacoprophylaxis and will continue along with nutrition and behavioral strategies.

## 2024-06-09 NOTE — Progress Notes (Signed)
 Office: [phone]  /  Fax: [phone]  Weight Summary and Body Composition Analysis (BIA)  Vitals Temp: 98 F (36.7 C) BP: 114/77 Pulse Rate: 75 SpO2: 100 %   Anthropometric Measurements Height: 5' (1.524 m) Weight: 146 lb (66.2 kg) BMI (Calculated): 28.51 Weight at Last Visit: 145 lb Weight Lost Since Last Visit: 0 lb Weight Gained Since Last Visit: 1 lb Starting Weight: 157 lb Total Weight Loss (lbs): 11 lb (4.99 kg) Peak Weight: 165 lb   Body Composition  Body Fat %: 32.9 % Fat Mass (lbs): 48.2 lbs Muscle Mass (lbs): 93.2 lbs Total Body Water (lbs): 62.6 lbs Visceral Fat Rating : 7    RMR: 1699  Today's Visit #: 13  Starting Date: 01/21/23   Subjective   Chief Complaint: Obesity  Interval History Discussed the use of AI scribe software for clinical note transcription with the patient, who gave verbal consent to proceed.  History of Present Illness Madison Robinson is a 48 year old female who presents for medical weight management.  Since last office visit she has gained 1 pound.  She is following a 1200-calorie nutrition plan 70% of the time.  She is exercising 3 days a week 25 minutes.  She is currently on Zepbound , 5 mg once a week, which is more effective than the 2.5 mg dose. She attempted to take it every other week but experienced a 'wearing off' effect, She experiences mild queasiness for a few hours post-dose, alleviated by ginger ale, without significant nausea.  Her current weight is approximately 145-146 pounds, and she wants to lose a few more pounds. She is exploring different dosing schedules to optimize weight loss while managing medication costs.  In terms of lifestyle, she drinks more water, often with Crystal Light, and engages in physical activity about three days a week for 20 minutes each session. She walks frequently at work, exceeding 5,000 steps daily, uses an elliptical at home, and incorporates hand weights into her  routine.     Challenges affecting patient progress: low volume of physical activity at present .    Pharmacotherapy for weight management: She is currently taking Zepbound  with adequate clinical response  and without side effects..   Assessment and Plan   Treatment Plan For Obesity:  Recommended Dietary Goals  Madison Robinson is currently in the action stage of change. As such, her goal is to continue weight management plan. She has agreed to: continue current plan  Behavioral Health and Counseling  We discussed the following behavioral modification strategies today: continue to work on maintaining a reduced calorie state, getting the recommended amount of protein, incorporating whole foods, making healthy choices, staying well hydrated and practicing mindfulness when eating. and increase protein intake, fibrous foods (25 grams per day for women, 30 grams for men) and water to improve satiety and decrease hunger signals. .  Additional education and resources provided today: Handout on traveling and holiday eating strategies  Recommended Physical Activity Goals  Madison Robinson has been advised to work up to 150 minutes of moderate intensity aerobic activity a week and strengthening exercises 2-3 times per week for cardiovascular health, weight loss maintenance and preservation of muscle mass.  She has agreed to :  Increase and monitor steps for a goal of 10,000 per day, Increase volume of physical activity to a goal of 240 minutes a week, and Combine aerobic and strengthening exercises for efficiency and improved cardiometabolic health.  Medical Interventions and Pharmacotherapy  We discussed various medication options to  help Madison Robinson with her weight loss efforts and we both agreed to : Adequate clinical response to anti-obesity medication, continue current regimen and she will like to continue taking Zepbound  every other week to save money we discussed some of the limitations regarding this  approach.  We also discussed lower cost alternatives like generic liraglutide which she would like to explore further in the near future.  Associated Conditions Impacted by Obesity Treatment  Assessment & Plan Prediabetes BMI 28.0-28.9,adult Generalized obesity -starting BMI of 30 and a peak of 32 Weight: decrease of 18 lb (11%) over 1 year, 6 months  Start: 11/27/2022 164 lb (74.4 kg)  End: 06/09/2024 146 lb (66.2 kg)  Management with Zepbound  5 mg weekly. Reports better efficacy with 5 mg dose compared to 2.5 mg. Experienced wearing off effect when extending dose to every other week, leading to slight weight gain and increased muscle mass. Body fat percentage reduced to 32%. No significant nausea, only mild queasiness initially. Discussed potential switch to generic Victoza for cost savings and flexibility in dosing. Current goal is to maintain weight and potentially lose additional 10 pounds. Discussed potential cost savings and flexibility with Victoza, which requires daily injections but allows for dose adjustments without refill constraints. - Continue Zepbound  5 mg weekly until current prescription is exhausted. - Consider transitioning to generic Victoza in January for cost savings and dosing flexibility. - Encouraged maintaining physical activity with a goal of 5,000 to 10,000 steps daily and 240 minutes of exercise weekly. - Will schedule follow-up appointment in early January.Reviewed most recent labs her fasting blood sugar was normal.  Her hemoglobin A1c has improved to 5.6 and her insulin  levels are at 10.  She is currently on Zepbound  for pharmacoprophylaxis and will continue along with nutrition and behavioral strategies.         Objective   Physical Exam:  Blood pressure 114/77, pulse 75, temperature 98 F (36.7 C), height 5' (1.524 m), weight 146 lb (66.2 kg), SpO2 100%. Body mass index is 28.51 kg/m.  General: She is overweight, cooperative, alert, well developed, and  in no acute distress. PSYCH: Has normal mood, affect and thought process.   HEENT: EOMI, sclerae are anicteric. Lungs: Normal breathing effort, no conversational dyspnea. Extremities: No edema.  Neurologic: No gross sensory or motor deficits. No tremors or fasciculations noted.    Diagnostic Data Reviewed:  BMET    Component Value Date/Time   NA 138 02/24/2024 0824   K 4.1 02/24/2024 0824   CL 101 02/24/2024 0824   CO2 22 02/24/2024 0824   GLUCOSE 72 02/24/2024 0824   GLUCOSE 82 07/29/2022 0734   BUN 11 02/24/2024 0824   CREATININE 0.65 02/24/2024 0824   CREATININE 0.61 06/19/2020 0921   CALCIUM 9.5 02/24/2024 0824   GFRNONAA 109 06/19/2020 0921   GFRAA 127 06/19/2020 0921   Lab Results  Component Value Date   HGBA1C 5.6 02/24/2024   HGBA1C 5.7 (H) 01/21/2023   Lab Results  Component Value Date   INSULIN  10.5 02/24/2024   INSULIN  12.2 01/21/2023   Lab Results  Component Value Date   TSH 0.69 07/29/2022   CBC    Component Value Date/Time   WBC 5.3 01/21/2023 0830   WBC 3.3 (L) 12/10/2017 1141   RBC 4.35 01/21/2023 0830   RBC 4.36 12/10/2017 1141   HGB 12.8 01/21/2023 0830   HCT 38.5 01/21/2023 0830   PLT 204.0 12/10/2017 1141   MCV 89 01/21/2023 0830   MCH 29.4 01/21/2023  0830   MCH 25.7 (L) 03/17/2013 1030   MCHC 33.2 01/21/2023 0830   MCHC 34.0 12/10/2017 1141   RDW 13.1 01/21/2023 0830   Iron Studies No results found for: IRON, TIBC, FERRITIN, IRONPCTSAT Lipid Panel     Component Value Date/Time   CHOL 160 02/24/2024 0824   TRIG 112 02/24/2024 0824   HDL 43 02/24/2024 0824   CHOLHDL 3 07/29/2022 0734   VLDL 15.6 07/29/2022 0734   LDLCALC 97 02/24/2024 0824   LDLCALC 92 06/19/2020 0921   Hepatic Function Panel     Component Value Date/Time   PROT 6.9 02/24/2024 0824   ALBUMIN 4.4 02/24/2024 0824   AST 20 02/24/2024 0824   ALT 21 02/24/2024 0824   ALKPHOS 70 02/24/2024 0824   BILITOT 0.6 02/24/2024 0824      Component Value  Date/Time   TSH 0.69 07/29/2022 0734   Nutritional Lab Results  Component Value Date   VD25OH 32.7 01/21/2023    Medications: Outpatient Encounter Medications as of 06/09/2024  Medication Sig   Cholecalciferol (VITAMIN D3) 50 MCG (2000 UT) capsule Take 1 capsule (2,000 Units total) by mouth daily.   hydrOXYzine  (ATARAX ) 25 MG tablet Take 1 tablet (25 mg total) by mouth every 12 (twelve) hours as needed for anxiety.   levonorgestrel (MIRENA) 20 MCG/24HR IUD Mirena 20 mcg/24 hours (5 yrs) 52 mg intrauterine device  Take 1 device by intrauterine route.   sertraline  (ZOLOFT ) 50 MG tablet TAKE 1 TABLET BY MOUTH EVERY DAY   tirzepatide  5 MG/0.5ML injection vial Inject 5 mg into the skin once a week.   No facility-administered encounter medications on file as of 06/09/2024.     Follow-Up   Return in about 7 weeks (around 07/28/2024) for For Weight Mangement with Dr. Francyne.Madison Robinson She was informed of the importance of frequent follow up visits to maximize her success with intensive lifestyle modifications for her multiple health conditions.  Attestation Statement   Reviewed by clinician on day of visit: allergies, medications, problem list, medical history, surgical history, family history, social history, and previous encounter notes.     Lucas Francyne, MD

## 2024-07-12 ENCOUNTER — Other Ambulatory Visit (INDEPENDENT_AMBULATORY_CARE_PROVIDER_SITE_OTHER): Payer: Self-pay | Admitting: Internal Medicine

## 2024-07-12 DIAGNOSIS — E66811 Obesity, class 1: Secondary | ICD-10-CM

## 2024-07-12 DIAGNOSIS — R7303 Prediabetes: Secondary | ICD-10-CM

## 2024-07-27 ENCOUNTER — Encounter (INDEPENDENT_AMBULATORY_CARE_PROVIDER_SITE_OTHER): Payer: Self-pay | Admitting: Internal Medicine

## 2024-07-27 ENCOUNTER — Ambulatory Visit (INDEPENDENT_AMBULATORY_CARE_PROVIDER_SITE_OTHER): Admitting: Internal Medicine

## 2024-07-27 VITALS — BP 114/71 | HR 82 | Temp 98.0°F | Ht 60.0 in | Wt 150.0 lb

## 2024-07-27 DIAGNOSIS — E669 Obesity, unspecified: Secondary | ICD-10-CM

## 2024-07-27 DIAGNOSIS — Z6829 Body mass index (BMI) 29.0-29.9, adult: Secondary | ICD-10-CM | POA: Diagnosis not present

## 2024-07-27 DIAGNOSIS — R7303 Prediabetes: Secondary | ICD-10-CM

## 2024-07-27 MED ORDER — TIRZEPATIDE-WEIGHT MANAGEMENT 7.5 MG/0.5ML ~~LOC~~ SOLN: 7.5000 mg | SUBCUTANEOUS | 0 refills | Status: AC

## 2024-07-27 NOTE — Assessment & Plan Note (Signed)
 Weight: decrease of 14 lb (8.5%) over 1 year, 8 months  Start: 11/27/2022 164 lb (74.4 kg)  End: 07/27/2024 150 lb (68 kg)  She is experiencing weight regain due to labs during the holiday season.  She is also noticing some tolerance to current dose of Zepbound  and a wearing off effect.Management with  Current weight is under 140 lbs with a BMI of 29. Body fat percentage is 33%. Recent weight gain of 4 lbs during the holidays. Previous Zepbound  5 mg was less effective, possibly due to developing tolerance. Discussed increasing dose to 7.5 mg. Considered alternative medications like Wegovy and Zepbound  pill, but she prefers to avoid Wegovy due to potential side effects such as nausea and vomiting. Discussed insurance coverage and cost considerations for Zepbound . Emphasized importance of calorie deficit and physical activity for weight management. Discussed potential insurance coverage requirements. - Increased Zepbound  to 7.5 mg weekly. - Check insurance coverage for Zepbound  on the Zepbound  website and contact insurance for any prerequisites. - Use calorie tracking apps like My Fitness Pal, lose it, or My Net Diary. - Aim for a 500 calorie deficit per day to lose 1 lb per week. - Engage in physical activity at least three times a week, using home elliptical. - Will schedule follow-up in one month to assess progress on 7.5 mg dose. -Continue Zepbound  for diabetes prevention

## 2024-07-27 NOTE — Progress Notes (Signed)
 "  Office: [phone]  /  Fax: [phone]  Weight Summary and Body Composition Analysis (BIA)  Vitals Temp: 98 F (36.7 C) BP: 114/71 Pulse Rate: 82 SpO2: 100 %   Anthropometric Measurements Height: 5' (1.524 m) Weight: 150 lb (68 kg) BMI (Calculated): 29.3 Weight at Last Visit: 146 lb Weight Lost Since Last Visit: 0 lb Weight Gained Since Last Visit: 4 lb Starting Weight: 157 lb Total Weight Loss (lbs): 7 lb (3.175 kg) Peak Weight: 156 lb   Body Composition  Body Fat %: 33.8 % Fat Mass (lbs): 50.8 lbs Muscle Mass (lbs): 94.4 lbs Total Body Water (lbs): 62.8 lbs Visceral Fat Rating : 7    RMR: [id number]  Today's Visit #: 13  Starting Date: 01/21/23   Subjective   Chief Complaint: Obesity  Interval History  Discussed the use of AI scribe software for clinical note transcription with the patient, who gave verbal consent to proceed.  History of Present Illness Madison Robinson is a 49 year old female who presents for medical management of her weight.   She has been experiencing challenges with weight management, particularly during the holiday season, resulting in a weight gain of four pounds. She feels that her current medication regimen is not as effective as it had been previously.  She is currently on Zepbound  5 mg once a week and is paying for medication out-of-pocket.  From a nutrition standpoint she is following a 1200-calorie target 70% of the time which is less than a 30% reduction in her basal metabolic rate may not be enough of a deficit.  She has been taking her medication every ten days, but notes that towards the end of the dosing interval, the effects seem to wear off. Initially, the medication was more effective, but she feels she may be developing a tolerance.  She discusses concerns about potential hormonal changes, noting that she believes she is perimenopausal and has experienced her first menstrual cycle in a long time.  She has not been  exercising regularly due to work commitments and holiday activities but has recently started using her elliptical machine at home, aiming for at least three sessions per week.      Challenges affecting patient progress: strong hunger signals and/or impaired satiety / inhibitory control, difficulty implementing reduced calorie nutrition plan, and low volume of physical activity at present .    Pharmacotherapy for weight management: She is currently taking Zepbound  with waning clinical response and without side effects..   Assessment and Plan   Treatment Plan For Obesity:  Recommended Dietary Goals  Madison Robinson is currently in the action stage of change. As such, her goal is to continue weight management plan. She has agreed to: keep a food journal with a target of  1000 calories per day and 75 grams of protein per day or  25 grams per meal.  Behavioral Health and Counseling  We discussed the following behavioral modification strategies today: work on meal planning and preparation and work on tracking and journaling calories using tracking application.  Additional education and resources provided today: None  Recommended Physical Activity Goals  Madison Robinson has been advised to work up to 150 minutes of moderate intensity aerobic activity a week and strengthening exercises 2-3 times per week for cardiovascular health, weight loss maintenance and preservation of muscle mass.  She has agreed to :  Think about enjoyable ways to increase daily physical activity and overcoming barriers to exercise and Increase physical activity in their day and reduce  sedentary time (increase NEAT).  Medical Interventions and Pharmacotherapy  We discussed various medication options to help Agatha with her weight loss efforts and we both agreed to : Increase Zepbound  to 7.5 mg once a week and Patient was counseled on the importance of maintaining healthy lifestyle habits, including balanced nutrition, regular  physical activity, and behavioral modifications, while taking antiobesity medication.  Patient verbalized understanding that medication is an adjunct to, not a replacement for, lifestyle changes and that the long-term success and weight maintenance depend on continued adherence to these strategies.  Associated Conditions Impacted by Obesity Treatment  Assessment & Plan Generalized obesity Prediabetes Weight: decrease of 14 lb (8.5%) over 1 year, 8 months  Start: 11/27/2022 164 lb (74.4 kg)  End: 07/27/2024 150 lb (68 kg)  She is experiencing weight regain due to labs during the holiday season.  She is also noticing some tolerance to current dose of Zepbound  and a wearing off effect.Management with  Current weight is under 140 lbs with a BMI of 29. Body fat percentage is 33%. Recent weight gain of 4 lbs during the holidays. Previous Zepbound  5 mg was less effective, possibly due to developing tolerance. Discussed increasing dose to 7.5 mg. Considered alternative medications like Wegovy and Zepbound  pill, but she prefers to avoid Wegovy due to potential side effects such as nausea and vomiting. Discussed insurance coverage and cost considerations for Zepbound . Emphasized importance of calorie deficit and physical activity for weight management. Discussed potential insurance coverage requirements. - Increased Zepbound  to 7.5 mg weekly. - Check insurance coverage for Zepbound  on the Zepbound  website and contact insurance for any prerequisites. - Use calorie tracking apps like My Fitness Pal, lose it, or My Net Diary. - Aim for a 500 calorie deficit per day to lose 1 lb per week. - Engage in physical activity at least three times a week, using home elliptical. - Will schedule follow-up in one month to assess progress on 7.5 mg dose. -Continue Zepbound  for diabetes prevention         Objective   Physical Exam:  Blood pressure 114/71, pulse 82, temperature 98 F (36.7 C), height 5' (1.524 m),  weight 150 lb (68 kg), last menstrual period 07/27/2024, SpO2 100%. Body mass index is 29.29 kg/m.  General: She is overweight, cooperative, alert, well developed, and in no acute distress. PSYCH: Has normal mood, affect and thought process.   HEENT: EOMI, sclerae are anicteric. Lungs: Normal breathing effort, no conversational dyspnea. Extremities: No edema.  Neurologic: No gross sensory or motor deficits. No tremors or fasciculations noted.    Diagnostic Data Reviewed:  BMET    Component Value Date/Time   NA 138 02/24/2024 0824   K 4.1 02/24/2024 0824   CL 101 02/24/2024 0824   CO2 22 02/24/2024 0824   GLUCOSE 72 02/24/2024 0824   GLUCOSE 82 07/29/2022 0734   BUN 11 02/24/2024 0824   CREATININE 0.65 02/24/2024 0824   CREATININE 0.61 06/19/2020 0921   CALCIUM 9.5 02/24/2024 0824   GFRNONAA 109 06/19/2020 0921   GFRAA 127 06/19/2020 0921   Lab Results  Component Value Date   HGBA1C 5.6 02/24/2024   HGBA1C 5.7 (H) 01/21/2023   Lab Results  Component Value Date   INSULIN  10.5 02/24/2024   INSULIN  12.2 01/21/2023   Lab Results  Component Value Date   TSH 0.69 07/29/2022   CBC    Component Value Date/Time   WBC 5.3 01/21/2023 0830   WBC 3.3 (L) 12/10/2017 1141  RBC 4.35 01/21/2023 0830   RBC 4.36 12/10/2017 1141   HGB 12.8 01/21/2023 0830   HCT 38.5 01/21/2023 0830   PLT 204.0 12/10/2017 1141   MCV 89 01/21/2023 0830   MCH 29.4 01/21/2023 0830   MCH 25.7 (L) 03/17/2013 1030   MCHC 33.2 01/21/2023 0830   MCHC 34.0 12/10/2017 1141   RDW 13.1 01/21/2023 0830   Iron Studies No results found for: IRON, TIBC, FERRITIN, IRONPCTSAT Lipid Panel     Component Value Date/Time   CHOL 160 02/24/2024 0824   TRIG 112 02/24/2024 0824   HDL 43 02/24/2024 0824   CHOLHDL 3 07/29/2022 0734   VLDL 15.6 07/29/2022 0734   LDLCALC 97 02/24/2024 0824   LDLCALC 92 06/19/2020 0921   Hepatic Function Panel     Component Value Date/Time   PROT 6.9 02/24/2024 0824    ALBUMIN 4.4 02/24/2024 0824   AST 20 02/24/2024 0824   ALT 21 02/24/2024 0824   ALKPHOS 70 02/24/2024 0824   BILITOT 0.6 02/24/2024 0824      Component Value Date/Time   TSH 0.69 07/29/2022 0734   Nutritional Lab Results  Component Value Date   VD25OH 32.7 01/21/2023    Medications: Outpatient Encounter Medications as of 07/27/2024  Medication Sig   Cholecalciferol (VITAMIN D3) 50 MCG (2000 UT) capsule Take 1 capsule (2,000 Units total) by mouth daily.   hydrOXYzine  (ATARAX ) 25 MG tablet Take 1 tablet (25 mg total) by mouth every 12 (twelve) hours as needed for anxiety.   levonorgestrel (MIRENA) 20 MCG/24HR IUD Mirena 20 mcg/24 hours (5 yrs) 52 mg intrauterine device  Take 1 device by intrauterine route.   sertraline  (ZOLOFT ) 50 MG tablet TAKE 1 TABLET BY MOUTH EVERY DAY   tirzepatide  5 MG/0.5ML injection vial Inject 5 mg into the skin once a week.   No facility-administered encounter medications on file as of 07/27/2024.     Follow-Up   No follow-ups on file.SABRA She was informed of the importance of frequent follow up visits to maximize her success with intensive lifestyle modifications for her multiple health conditions.  Attestation Statement   Reviewed by clinician on day of visit: allergies, medications, problem list, medical history, surgical history, family history, social history, and previous encounter notes.     Lucas Parker, MD  "

## 2024-08-10 ENCOUNTER — Ambulatory Visit (INDEPENDENT_AMBULATORY_CARE_PROVIDER_SITE_OTHER): Admitting: Family Medicine

## 2024-08-24 ENCOUNTER — Other Ambulatory Visit (INDEPENDENT_AMBULATORY_CARE_PROVIDER_SITE_OTHER): Payer: Self-pay | Admitting: Internal Medicine

## 2024-08-24 DIAGNOSIS — E669 Obesity, unspecified: Secondary | ICD-10-CM

## 2024-08-24 DIAGNOSIS — R7303 Prediabetes: Secondary | ICD-10-CM

## 2024-09-07 ENCOUNTER — Ambulatory Visit (INDEPENDENT_AMBULATORY_CARE_PROVIDER_SITE_OTHER): Admitting: Internal Medicine
